# Patient Record
Sex: Female | Born: 1935 | Race: White | Hispanic: No | Smoking: Never smoker
Health system: Southern US, Community
[De-identification: ages and names within clinical notes are randomized; demographics above are authoritative.]

## PROBLEM LIST (undated history)

## (undated) DIAGNOSIS — G479 Sleep disorder, unspecified: Secondary | ICD-10-CM

## (undated) DIAGNOSIS — M8580 Other specified disorders of bone density and structure, unspecified site: Secondary | ICD-10-CM

## (undated) DIAGNOSIS — R252 Cramp and spasm: Secondary | ICD-10-CM

## (undated) DIAGNOSIS — I10 Essential (primary) hypertension: Secondary | ICD-10-CM

## (undated) DIAGNOSIS — R350 Frequency of micturition: Secondary | ICD-10-CM

## (undated) DIAGNOSIS — E559 Vitamin D deficiency, unspecified: Secondary | ICD-10-CM

## (undated) DIAGNOSIS — K635 Polyp of colon: Secondary | ICD-10-CM

## (undated) DIAGNOSIS — K648 Other hemorrhoids: Secondary | ICD-10-CM

## (undated) DIAGNOSIS — H269 Unspecified cataract: Secondary | ICD-10-CM

## (undated) DIAGNOSIS — I639 Cerebral infarction, unspecified: Secondary | ICD-10-CM

## (undated) DIAGNOSIS — K5792 Diverticulitis of intestine, part unspecified, without perforation or abscess without bleeding: Secondary | ICD-10-CM

## (undated) HISTORY — PX: UTERINE FIBROID SURGERY: SHX826

## (undated) HISTORY — DX: Other hemorrhoids: K64.8

## (undated) HISTORY — PX: CATARACT EXTRACTION: SUR2

## (undated) HISTORY — DX: Other specified disorders of bone density and structure, unspecified site: M85.80

## (undated) HISTORY — DX: Unspecified cataract: H26.9

## (undated) HISTORY — DX: Essential (primary) hypertension: I10

## (undated) HISTORY — DX: Vitamin D deficiency, unspecified: E55.9

## (undated) HISTORY — DX: Sleep disorder, unspecified: G47.9

## (undated) HISTORY — DX: Diverticulitis of intestine, part unspecified, without perforation or abscess without bleeding: K57.92

## (undated) HISTORY — DX: Cramp and spasm: R25.2

## (undated) HISTORY — DX: Frequency of micturition: R35.0

## (undated) HISTORY — PX: COLONOSCOPY: SHX174

## (undated) HISTORY — DX: Cerebral infarction, unspecified: I63.9

---
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
## 1988-09-09 DIAGNOSIS — K635 Polyp of colon: Secondary | ICD-10-CM

## 1988-09-09 HISTORY — DX: Polyp of colon: K63.5

## 1998-02-27 ENCOUNTER — Ambulatory Visit (HOSPITAL_COMMUNITY): Admission: RE | Admit: 1998-02-27 | Discharge: 1998-02-27 | Payer: Self-pay | Admitting: *Deleted

## 1998-07-19 ENCOUNTER — Ambulatory Visit (HOSPITAL_COMMUNITY): Admission: RE | Admit: 1998-07-19 | Discharge: 1998-07-19 | Payer: Self-pay | Admitting: Endocrinology

## 1998-07-19 ENCOUNTER — Encounter: Payer: Self-pay | Admitting: Endocrinology

## 1999-06-18 ENCOUNTER — Other Ambulatory Visit: Admission: RE | Admit: 1999-06-18 | Discharge: 1999-06-18 | Payer: Self-pay | Admitting: Obstetrics and Gynecology

## 1999-08-06 ENCOUNTER — Encounter: Payer: Self-pay | Admitting: Endocrinology

## 1999-08-06 ENCOUNTER — Ambulatory Visit (HOSPITAL_COMMUNITY): Admission: RE | Admit: 1999-08-06 | Discharge: 1999-08-06 | Payer: Self-pay | Admitting: Endocrinology

## 1999-10-10 ENCOUNTER — Encounter: Admission: RE | Admit: 1999-10-10 | Discharge: 1999-10-10 | Payer: Self-pay | Admitting: *Deleted

## 1999-10-10 ENCOUNTER — Encounter: Payer: Self-pay | Admitting: *Deleted

## 2000-08-05 ENCOUNTER — Encounter: Payer: Self-pay | Admitting: Internal Medicine

## 2000-08-05 ENCOUNTER — Ambulatory Visit (HOSPITAL_COMMUNITY): Admission: RE | Admit: 2000-08-05 | Discharge: 2000-08-05 | Payer: Self-pay | Admitting: Internal Medicine

## 2000-08-27 ENCOUNTER — Ambulatory Visit (HOSPITAL_COMMUNITY): Admission: RE | Admit: 2000-08-27 | Discharge: 2000-08-27 | Payer: Self-pay | Admitting: Internal Medicine

## 2000-08-27 ENCOUNTER — Encounter: Payer: Self-pay | Admitting: Internal Medicine

## 2001-03-11 ENCOUNTER — Ambulatory Visit (HOSPITAL_COMMUNITY): Admission: RE | Admit: 2001-03-11 | Discharge: 2001-03-11 | Payer: Self-pay | Admitting: Internal Medicine

## 2001-03-11 ENCOUNTER — Encounter: Payer: Self-pay | Admitting: Internal Medicine

## 2001-07-06 ENCOUNTER — Encounter: Payer: Self-pay | Admitting: Obstetrics and Gynecology

## 2001-07-06 ENCOUNTER — Encounter: Admission: RE | Admit: 2001-07-06 | Discharge: 2001-07-06 | Payer: Self-pay | Admitting: Obstetrics and Gynecology

## 2001-11-04 ENCOUNTER — Encounter: Payer: Self-pay | Admitting: Orthopedic Surgery

## 2001-11-04 ENCOUNTER — Encounter: Admission: RE | Admit: 2001-11-04 | Discharge: 2001-11-04 | Payer: Self-pay | Admitting: Orthopedic Surgery

## 2002-03-23 ENCOUNTER — Encounter: Payer: Self-pay | Admitting: Internal Medicine

## 2002-03-23 ENCOUNTER — Ambulatory Visit (HOSPITAL_COMMUNITY): Admission: RE | Admit: 2002-03-23 | Discharge: 2002-03-23 | Payer: Self-pay | Admitting: Internal Medicine

## 2002-11-18 ENCOUNTER — Encounter: Payer: Self-pay | Admitting: Otolaryngology

## 2002-11-18 ENCOUNTER — Encounter: Admission: RE | Admit: 2002-11-18 | Discharge: 2002-11-18 | Payer: Self-pay | Admitting: Otolaryngology

## 2003-04-07 ENCOUNTER — Ambulatory Visit (HOSPITAL_COMMUNITY): Admission: RE | Admit: 2003-04-07 | Discharge: 2003-04-07 | Payer: Self-pay | Admitting: Internal Medicine

## 2003-04-07 ENCOUNTER — Encounter: Payer: Self-pay | Admitting: Internal Medicine

## 2003-05-09 ENCOUNTER — Ambulatory Visit (HOSPITAL_COMMUNITY): Admission: RE | Admit: 2003-05-09 | Discharge: 2003-05-09 | Payer: Self-pay | Admitting: *Deleted

## 2003-06-08 ENCOUNTER — Encounter: Admission: RE | Admit: 2003-06-08 | Discharge: 2003-07-06 | Payer: Self-pay | Admitting: Orthopedic Surgery

## 2004-03-19 ENCOUNTER — Encounter: Admission: RE | Admit: 2004-03-19 | Discharge: 2004-03-22 | Payer: Self-pay | Admitting: Orthopedic Surgery

## 2004-04-12 ENCOUNTER — Ambulatory Visit (HOSPITAL_COMMUNITY): Admission: RE | Admit: 2004-04-12 | Discharge: 2004-04-12 | Payer: Self-pay | Admitting: Internal Medicine

## 2005-04-25 ENCOUNTER — Ambulatory Visit (HOSPITAL_COMMUNITY): Admission: RE | Admit: 2005-04-25 | Discharge: 2005-04-25 | Payer: Self-pay | Admitting: Internal Medicine

## 2005-07-03 ENCOUNTER — Ambulatory Visit (HOSPITAL_COMMUNITY): Admission: RE | Admit: 2005-07-03 | Discharge: 2005-07-03 | Payer: Self-pay | Admitting: *Deleted

## 2006-06-18 ENCOUNTER — Ambulatory Visit (HOSPITAL_COMMUNITY): Admission: RE | Admit: 2006-06-18 | Discharge: 2006-06-18 | Payer: Self-pay | Admitting: Internal Medicine

## 2007-06-25 ENCOUNTER — Ambulatory Visit (HOSPITAL_COMMUNITY): Admission: RE | Admit: 2007-06-25 | Discharge: 2007-06-25 | Payer: Self-pay | Admitting: Internal Medicine

## 2007-07-06 ENCOUNTER — Encounter: Admission: RE | Admit: 2007-07-06 | Discharge: 2007-07-06 | Payer: Self-pay | Admitting: Internal Medicine

## 2008-07-01 ENCOUNTER — Ambulatory Visit (HOSPITAL_COMMUNITY): Admission: RE | Admit: 2008-07-01 | Discharge: 2008-07-01 | Payer: Self-pay | Admitting: Internal Medicine

## 2008-11-29 ENCOUNTER — Encounter: Admission: RE | Admit: 2008-11-29 | Discharge: 2008-11-29 | Payer: Self-pay | Admitting: Internal Medicine

## 2009-07-12 ENCOUNTER — Ambulatory Visit (HOSPITAL_COMMUNITY): Admission: RE | Admit: 2009-07-12 | Discharge: 2009-07-12 | Payer: Self-pay | Admitting: Internal Medicine

## 2010-07-19 ENCOUNTER — Ambulatory Visit (HOSPITAL_COMMUNITY): Admission: RE | Admit: 2010-07-19 | Discharge: 2010-07-19 | Payer: Self-pay | Admitting: Internal Medicine

## 2010-11-08 ENCOUNTER — Other Ambulatory Visit: Payer: Self-pay | Admitting: Internal Medicine

## 2010-11-08 DIAGNOSIS — R490 Dysphonia: Secondary | ICD-10-CM

## 2010-11-19 ENCOUNTER — Other Ambulatory Visit: Payer: Self-pay | Admitting: Otolaryngology

## 2010-11-20 ENCOUNTER — Ambulatory Visit
Admission: RE | Admit: 2010-11-20 | Discharge: 2010-11-20 | Disposition: A | Discharge status: Home / Self Care | Payer: Medicare Other | Source: Ambulatory Visit | Attending: Otolaryngology | Admitting: Otolaryngology

## 2011-01-14 ENCOUNTER — Other Ambulatory Visit: Payer: Self-pay

## 2011-01-25 NOTE — Op Note (Signed)
   NAME:  Megan Bowen, Megan Bowen                      ACCOUNT NO.:  [id number]   MEDICAL RECORD NO.:  [id number]                   PATIENT TYPE:  AMB   LOCATION:  ENDO                                 FACILITY:  [facility]   PHYSICIAN:  Georgiana Spinner, M.D.                 DATE OF BIRTH:  Jul 07, 1936   DATE OF PROCEDURE:  DATE OF DISCHARGE:                                 OPERATIVE REPORT   PROCEDURE:  Colonoscopy.   INDICATIONS FOR PROCEDURE:  Colon polyps.   ANESTHESIA:  Fentanyl 100 mcg, Versed 10 mg.   DESCRIPTION OF PROCEDURE:  With the patient mildly sedated in the left  lateral decubitus position, the Olympus videoscopic colonoscope was inserted  in the rectum and passed under direct vision to the cecum identified by base  of cecum, ileocecal valve and transillumination of the right lower quadrant  of the abdomen. From this point, the colonoscope was slowly withdrawn taking  circumferential views of the colonic mucosa stopping only in the rectum  which appeared normal on direct and showed hemorrhoids on retroflexed view.  The endoscope was straightened and withdrawn. The patient's vital signs and  pulse oximeter remained stable. The patient tolerated the procedure well  without apparent complications.   FINDINGS:  Internal hemorrhoids otherwise unremarkable examination.   PLAN:  Repeat examination possibly in five years.                                               Georgiana Spinner, M.D.    GMO/MEDQ  D:  05/09/2003  T:  05/09/2003  Job:  [id number]

## 2011-01-25 NOTE — Op Note (Signed)
NAME:  Megan Bowen, Megan Bowen NO.:  [id number]   MEDICAL RECORD NO.:  [id number]          PATIENT TYPE:  AMB   LOCATION:  ENDO                         FACILITY:  [facility]   PHYSICIAN:  Georgiana Spinner, M.D.    DATE OF BIRTH:  06-Dec-1935   DATE OF PROCEDURE:  07/03/2005  DATE OF DISCHARGE:                                 OPERATIVE REPORT   PROCEDURE:  Upper endoscopy.   INDICATIONS:  Abdominal pain.   ANESTHESIA:  Fentanyl 50 mcg, Versed 7 mg.   PROCEDURE:  With the patient mildly sedated in the left lateral decubitus  position, the Olympus videoscopic endoscope was inserted in the mouth and  passed under direct vision through the esophagus which appeared normal.  There was no evidence of Barrett's or esophagitis. We then advanced into the  stomach. The fundus, body, antrum, duodenal bulb, second portion of the  duodenum were visualized. From this point, the endoscope was slowly  withdrawn taking circumferential views of the duodenal mucosa until the  endoscope had been pulled back in the stomach, placed in retroflexion to  view the stomach from below. The endoscope was straightened and withdrawn  taking circumferential views of the remaining gastric and esophageal mucosa.  The patient's vital signs and pulse oximeter remained stable. The patient  tolerated the procedure well without apparent complications.   FINDINGS:  Unremarkable examination.   PLAN:  Have patient follow-up with me as an outpatient.           ______________________________  Georgiana Spinner, M.D.     GMO/MEDQ  D:  07/03/2005  T:  07/03/2005  Job:  [id number]

## 2011-06-10 ENCOUNTER — Other Ambulatory Visit (HOSPITAL_COMMUNITY): Payer: Self-pay | Admitting: Internal Medicine

## 2011-06-10 DIAGNOSIS — Z1231 Encounter for screening mammogram for malignant neoplasm of breast: Secondary | ICD-10-CM

## 2011-07-25 ENCOUNTER — Ambulatory Visit (HOSPITAL_COMMUNITY)
Admission: RE | Admit: 2011-07-25 | Discharge: 2011-07-25 | Disposition: A | Discharge status: Home / Self Care | Payer: Medicare Other | Source: Ambulatory Visit | Attending: Internal Medicine | Admitting: Internal Medicine

## 2011-07-25 DIAGNOSIS — Z1231 Encounter for screening mammogram for malignant neoplasm of breast: Secondary | ICD-10-CM

## 2012-06-08 ENCOUNTER — Other Ambulatory Visit (HOSPITAL_COMMUNITY): Payer: Self-pay | Admitting: Internal Medicine

## 2012-06-08 DIAGNOSIS — Z1231 Encounter for screening mammogram for malignant neoplasm of breast: Secondary | ICD-10-CM

## 2012-07-30 ENCOUNTER — Ambulatory Visit (HOSPITAL_COMMUNITY)
Admission: RE | Admit: 2012-07-30 | Discharge: 2012-07-30 | Disposition: A | Discharge status: Home / Self Care | Payer: Medicare Other | Source: Ambulatory Visit | Attending: Internal Medicine | Admitting: Internal Medicine

## 2012-07-30 DIAGNOSIS — Z1231 Encounter for screening mammogram for malignant neoplasm of breast: Secondary | ICD-10-CM | POA: Insufficient documentation

## 2013-08-02 ENCOUNTER — Other Ambulatory Visit (HOSPITAL_COMMUNITY): Payer: Self-pay | Admitting: Internal Medicine

## 2013-08-02 DIAGNOSIS — Z1231 Encounter for screening mammogram for malignant neoplasm of breast: Secondary | ICD-10-CM

## 2013-09-23 ENCOUNTER — Ambulatory Visit (HOSPITAL_COMMUNITY)
Admission: RE | Admit: 2013-09-23 | Discharge: 2013-09-23 | Disposition: A | Discharge status: Home / Self Care | Payer: Medicare Other | Source: Ambulatory Visit | Attending: Internal Medicine | Admitting: Internal Medicine

## 2013-09-23 DIAGNOSIS — Z1231 Encounter for screening mammogram for malignant neoplasm of breast: Secondary | ICD-10-CM | POA: Insufficient documentation

## 2014-06-01 ENCOUNTER — Ambulatory Visit (INDEPENDENT_AMBULATORY_CARE_PROVIDER_SITE_OTHER): Payer: Medicare Other

## 2014-06-01 ENCOUNTER — Ambulatory Visit: Payer: Self-pay

## 2014-06-01 VITALS — BP 133/81 | HR 85 | Resp 16 | Ht 62.0 in | Wt 112.0 lb

## 2014-06-01 DIAGNOSIS — M722 Plantar fascial fibromatosis: Secondary | ICD-10-CM

## 2014-06-01 DIAGNOSIS — M79609 Pain in unspecified limb: Secondary | ICD-10-CM

## 2014-06-01 DIAGNOSIS — M79673 Pain in unspecified foot: Secondary | ICD-10-CM

## 2014-06-01 NOTE — Progress Notes (Signed)
   Subjective:    Patient ID: Megan Bowen, female    DOB: 28-Sep-1935, 78 y.o.   MRN: [id number]  HPI Comments: "I have pain in the heel"  Patient c/o aching plantar heel left for about 3 months. She does have AM pain. Went to [facility] been given 3 injections of cortisone between July and August. They did do xrays and she brought those today. It is better, but seems to still be really tender at night. She has tried stretching and massaging-helps. She already had a night splint and has not helped.  Foot Pain      Review of Systems  Constitutional: Positive for activity change.  All other systems reviewed and are negative.      Objective:   Physical Exam 78 year old white female well-developed well-nourished oriented x3 presents at this time with a several month history of inferior left heel pain has had multiple treatments including 3 steroid injections ice stretching anti-inflammatories or Advil has had improvement although not complete resolution continues to have pain along the mid band of the plantar fascia medial calcaneal tubercle. Pain on first up in the morning his most severe. Next  Neurovascular status is intact pedal pulses palpable DP and PT plus one over 4 capillary refill time 3 seconds epicritic and proprioceptive sensations intact and symmetric bilateral mild varicosities noted x-rays reviewed reveal no inferior heel spur myofascial thickening rectus foot otherwise unremarkable x-rays are noted. Orthopedic exam reveals symmetric the fat pad relatively average to high arch myofascial thickening on the left foot noted tenderness on palpation of the left foot mid band high-fashion arch right foot is asymptomatic. Currently wearing a pair of Brooks shoes however does have sandals around the house other shoes that have likely no support or stability.       Assessment & Plan:  Assessment this time plantar fasciitis/heel spur syndrome which is risk on it to some of  the treatment however on the long-term followup intervention in the form of orthoses this time with power step OTC orthotic is dispensed with break in wearing instructions recommend followup in one to months for adjustments if needed maintain ice to the area and anti-inflammatory in the interim as needed maintain good stable shoes at all times including orthotics in her Rolena Infante or her SAS shoes and at home can wear a Birkenstock or bionic type sandal recheck in one to 2 months for reevaluation patient understands orthotics likely noncovered by her insurance coverage orthotics for as a trial for the next one to 2 months we'll reevaluate thereafter.  Harriet Masson DPM

## 2014-06-01 NOTE — Patient Instructions (Signed)

## 2014-06-23 ENCOUNTER — Ambulatory Visit: Payer: Medicare Other | Admitting: Internal Medicine

## 2014-08-09 ENCOUNTER — Ambulatory Visit: Payer: Medicare Other

## 2014-09-14 ENCOUNTER — Ambulatory Visit: Payer: Medicare Other

## 2014-09-20 ENCOUNTER — Ambulatory Visit: Payer: Medicare Other | Attending: Physical Medicine and Rehabilitation | Admitting: Physical Therapy

## 2014-09-20 DIAGNOSIS — M5136 Other intervertebral disc degeneration, lumbar region: Secondary | ICD-10-CM | POA: Diagnosis not present

## 2014-09-20 DIAGNOSIS — M545 Low back pain: Secondary | ICD-10-CM | POA: Insufficient documentation

## 2014-09-26 DIAGNOSIS — F411 Generalized anxiety disorder: Secondary | ICD-10-CM | POA: Diagnosis not present

## 2014-09-26 DIAGNOSIS — I10 Essential (primary) hypertension: Secondary | ICD-10-CM | POA: Diagnosis not present

## 2014-09-26 DIAGNOSIS — M722 Plantar fascial fibromatosis: Secondary | ICD-10-CM | POA: Diagnosis not present

## 2014-09-26 DIAGNOSIS — F5104 Psychophysiologic insomnia: Secondary | ICD-10-CM | POA: Diagnosis not present

## 2014-09-27 ENCOUNTER — Ambulatory Visit: Payer: Medicare Other | Admitting: Physical Therapy

## 2014-09-27 DIAGNOSIS — M5136 Other intervertebral disc degeneration, lumbar region: Secondary | ICD-10-CM | POA: Diagnosis not present

## 2014-09-27 DIAGNOSIS — M545 Low back pain: Secondary | ICD-10-CM | POA: Diagnosis not present

## 2014-10-06 DIAGNOSIS — M79602 Pain in left arm: Secondary | ICD-10-CM | POA: Diagnosis not present

## 2014-10-06 DIAGNOSIS — R030 Elevated blood-pressure reading, without diagnosis of hypertension: Secondary | ICD-10-CM | POA: Diagnosis not present

## 2014-10-06 DIAGNOSIS — R002 Palpitations: Secondary | ICD-10-CM | POA: Diagnosis not present

## 2014-10-11 DIAGNOSIS — R002 Palpitations: Secondary | ICD-10-CM | POA: Diagnosis not present

## 2014-10-17 DIAGNOSIS — M79602 Pain in left arm: Secondary | ICD-10-CM | POA: Diagnosis not present

## 2014-10-17 DIAGNOSIS — R002 Palpitations: Secondary | ICD-10-CM | POA: Diagnosis not present

## 2014-10-27 DIAGNOSIS — R002 Palpitations: Secondary | ICD-10-CM | POA: Diagnosis not present

## 2014-10-27 DIAGNOSIS — R030 Elevated blood-pressure reading, without diagnosis of hypertension: Secondary | ICD-10-CM | POA: Diagnosis not present

## 2014-10-27 DIAGNOSIS — M79602 Pain in left arm: Secondary | ICD-10-CM | POA: Diagnosis not present

## 2014-10-27 DIAGNOSIS — R931 Abnormal findings on diagnostic imaging of heart and coronary circulation: Secondary | ICD-10-CM | POA: Diagnosis not present

## 2014-11-03 ENCOUNTER — Other Ambulatory Visit (HOSPITAL_COMMUNITY): Payer: Self-pay | Admitting: Internal Medicine

## 2014-11-03 DIAGNOSIS — Z1231 Encounter for screening mammogram for malignant neoplasm of breast: Secondary | ICD-10-CM

## 2014-11-08 ENCOUNTER — Ambulatory Visit: Payer: Self-pay

## 2014-11-17 DIAGNOSIS — H04213 Epiphora due to excess lacrimation, bilateral lacrimal glands: Secondary | ICD-10-CM | POA: Diagnosis not present

## 2014-11-17 DIAGNOSIS — H04123 Dry eye syndrome of bilateral lacrimal glands: Secondary | ICD-10-CM | POA: Diagnosis not present

## 2014-11-17 DIAGNOSIS — H01003 Unspecified blepharitis right eye, unspecified eyelid: Secondary | ICD-10-CM | POA: Diagnosis not present

## 2014-11-17 DIAGNOSIS — L718 Other rosacea: Secondary | ICD-10-CM | POA: Diagnosis not present

## 2014-11-29 ENCOUNTER — Ambulatory Visit (HOSPITAL_COMMUNITY)
Admission: RE | Admit: 2014-11-29 | Discharge: 2014-11-29 | Disposition: A | Discharge status: Home / Self Care | Payer: Medicare Other | Source: Ambulatory Visit | Attending: Internal Medicine | Admitting: Internal Medicine

## 2014-11-29 DIAGNOSIS — Z1231 Encounter for screening mammogram for malignant neoplasm of breast: Secondary | ICD-10-CM | POA: Insufficient documentation

## 2014-12-21 ENCOUNTER — Encounter: Payer: Self-pay | Admitting: Podiatry

## 2014-12-21 ENCOUNTER — Ambulatory Visit (INDEPENDENT_AMBULATORY_CARE_PROVIDER_SITE_OTHER): Payer: Medicare Other | Admitting: Podiatry

## 2014-12-21 VITALS — BP 131/75 | HR 84 | Resp 14

## 2014-12-21 DIAGNOSIS — M7742 Metatarsalgia, left foot: Secondary | ICD-10-CM

## 2014-12-21 DIAGNOSIS — M7741 Metatarsalgia, right foot: Secondary | ICD-10-CM

## 2014-12-21 DIAGNOSIS — M216X9 Other acquired deformities of unspecified foot: Secondary | ICD-10-CM | POA: Diagnosis not present

## 2014-12-21 NOTE — Progress Notes (Signed)
   Subjective:    Patient ID: Megan Bowen, female    DOB: Nov 23, 1935, 79 y.o.   MRN: [id number]  HPI N- sensitive and redness, burning sensation in both feet L-B/L Ball of feet and heel D-2 months O-slowly C-not getting worse A - pressure when wearing some shoes T-rolling cans, stretching  Symptoms she describes as a scraping burning sensation occur with weightbearing activity Patient has had ongoing physical examination including glucose levels without any elevation.  Review of Systems  All other systems reviewed and are negative.      Objective:   Physical Exam  Orientated 3 patient presents with her husband present treatment room  Vascular: DP and PT pulses 2/4 bilaterally Capillary reflex immediate bilaterally  Neurological: Ankle reflex equal and reactive bilaterally Vibratory sensation intact bilaterally Sensation to 10 g monofilament wire intact 5/5 bilaterally  Dermatological: Atrophic fad pads MPJ and heels bilaterally  Musculoskeletal: No restriction ankle, subtalar, midtarsal joints Mild palpable tenderness plantar inferior heel and plantar MPJ bilaterally       Assessment & Plan:   Assessment: Atrophic fad pads MPJ heels bilaterally Metatarsalgia bilaterally Plantar fasciitis bilaterally  Plan: I discussed with patient in detail that the primary cause of her discomfort seems to be related to her atrophic fad pads. I dispensed to [employer] metatarsal straps with good cushioning to attach to her forefoot area where a continuous basis and they thick sole walking or running style shoe  Also, patient has pending yearly evaluation and emphasized to patient to make sure her blood glucose levels were within normal limits because of her mention of burning  Reappoint at patient's request

## 2014-12-22 ENCOUNTER — Encounter: Payer: Self-pay | Admitting: Podiatry

## 2015-02-04 DIAGNOSIS — N390 Urinary tract infection, site not specified: Secondary | ICD-10-CM | POA: Diagnosis not present

## 2015-02-13 DIAGNOSIS — N390 Urinary tract infection, site not specified: Secondary | ICD-10-CM | POA: Diagnosis not present

## 2015-02-15 DIAGNOSIS — R300 Dysuria: Secondary | ICD-10-CM | POA: Diagnosis not present

## 2015-02-15 DIAGNOSIS — R3915 Urgency of urination: Secondary | ICD-10-CM | POA: Diagnosis not present

## 2015-02-15 DIAGNOSIS — R350 Frequency of micturition: Secondary | ICD-10-CM | POA: Diagnosis not present

## 2015-02-15 DIAGNOSIS — N952 Postmenopausal atrophic vaginitis: Secondary | ICD-10-CM | POA: Diagnosis not present

## 2015-02-15 DIAGNOSIS — R3989 Other symptoms and signs involving the genitourinary system: Secondary | ICD-10-CM | POA: Diagnosis not present

## 2015-03-28 DIAGNOSIS — E039 Hypothyroidism, unspecified: Secondary | ICD-10-CM | POA: Diagnosis not present

## 2015-03-28 DIAGNOSIS — R5383 Other fatigue: Secondary | ICD-10-CM | POA: Diagnosis not present

## 2015-03-28 DIAGNOSIS — I10 Essential (primary) hypertension: Secondary | ICD-10-CM | POA: Diagnosis not present

## 2015-04-04 DIAGNOSIS — F5104 Psychophysiologic insomnia: Secondary | ICD-10-CM | POA: Diagnosis not present

## 2015-04-04 DIAGNOSIS — Z0000 Encounter for general adult medical examination without abnormal findings: Secondary | ICD-10-CM | POA: Diagnosis not present

## 2015-04-04 DIAGNOSIS — I10 Essential (primary) hypertension: Secondary | ICD-10-CM | POA: Diagnosis not present

## 2015-05-01 DIAGNOSIS — Z1212 Encounter for screening for malignant neoplasm of rectum: Secondary | ICD-10-CM | POA: Diagnosis not present

## 2015-05-14 DIAGNOSIS — T22232A Burn of second degree of left upper arm, initial encounter: Secondary | ICD-10-CM | POA: Diagnosis not present

## 2015-07-04 DIAGNOSIS — H25813 Combined forms of age-related cataract, bilateral: Secondary | ICD-10-CM | POA: Diagnosis not present

## 2015-07-04 DIAGNOSIS — H04123 Dry eye syndrome of bilateral lacrimal glands: Secondary | ICD-10-CM | POA: Diagnosis not present

## 2015-10-20 DIAGNOSIS — G5791 Unspecified mononeuropathy of right lower limb: Secondary | ICD-10-CM | POA: Diagnosis not present

## 2015-11-02 DIAGNOSIS — Z7689 Persons encountering health services in other specified circumstances: Secondary | ICD-10-CM | POA: Diagnosis not present

## 2015-11-02 DIAGNOSIS — G5791 Unspecified mononeuropathy of right lower limb: Secondary | ICD-10-CM | POA: Diagnosis not present

## 2015-11-02 DIAGNOSIS — R0981 Nasal congestion: Secondary | ICD-10-CM | POA: Diagnosis not present

## 2015-11-19 DIAGNOSIS — R091 Pleurisy: Secondary | ICD-10-CM | POA: Diagnosis not present

## 2015-11-19 DIAGNOSIS — M940 Chondrocostal junction syndrome [Tietze]: Secondary | ICD-10-CM | POA: Diagnosis not present

## 2015-11-27 DIAGNOSIS — N390 Urinary tract infection, site not specified: Secondary | ICD-10-CM | POA: Diagnosis not present

## 2015-11-27 DIAGNOSIS — B3749 Other urogenital candidiasis: Secondary | ICD-10-CM | POA: Diagnosis not present

## 2015-11-27 DIAGNOSIS — R0781 Pleurodynia: Secondary | ICD-10-CM | POA: Diagnosis not present

## 2015-11-27 DIAGNOSIS — R091 Pleurisy: Secondary | ICD-10-CM | POA: Diagnosis not present

## 2015-11-27 DIAGNOSIS — M940 Chondrocostal junction syndrome [Tietze]: Secondary | ICD-10-CM | POA: Diagnosis not present

## 2015-11-30 ENCOUNTER — Other Ambulatory Visit (HOSPITAL_BASED_OUTPATIENT_CLINIC_OR_DEPARTMENT_OTHER): Payer: Self-pay | Admitting: Internal Medicine

## 2015-11-30 DIAGNOSIS — Z1231 Encounter for screening mammogram for malignant neoplasm of breast: Secondary | ICD-10-CM

## 2015-12-06 DIAGNOSIS — R0981 Nasal congestion: Secondary | ICD-10-CM | POA: Diagnosis not present

## 2015-12-06 DIAGNOSIS — R091 Pleurisy: Secondary | ICD-10-CM | POA: Diagnosis not present

## 2015-12-06 DIAGNOSIS — M940 Chondrocostal junction syndrome [Tietze]: Secondary | ICD-10-CM | POA: Diagnosis not present

## 2015-12-19 ENCOUNTER — Ambulatory Visit (HOSPITAL_BASED_OUTPATIENT_CLINIC_OR_DEPARTMENT_OTHER): Payer: Medicare Other

## 2016-01-15 ENCOUNTER — Ambulatory Visit (HOSPITAL_BASED_OUTPATIENT_CLINIC_OR_DEPARTMENT_OTHER): Payer: Medicare Other

## 2016-03-14 ENCOUNTER — Ambulatory Visit (HOSPITAL_BASED_OUTPATIENT_CLINIC_OR_DEPARTMENT_OTHER)
Admission: RE | Admit: 2016-03-14 | Discharge: 2016-03-14 | Disposition: A | Discharge status: Home / Self Care | Payer: Medicare Other | Source: Ambulatory Visit | Attending: Internal Medicine | Admitting: Internal Medicine

## 2016-03-14 DIAGNOSIS — Z1231 Encounter for screening mammogram for malignant neoplasm of breast: Secondary | ICD-10-CM

## 2017-02-24 ENCOUNTER — Encounter: Payer: Self-pay | Admitting: Podiatry

## 2017-02-24 ENCOUNTER — Ambulatory Visit (INDEPENDENT_AMBULATORY_CARE_PROVIDER_SITE_OTHER): Payer: Medicare Other | Admitting: Podiatry

## 2017-02-24 VITALS — BP 139/60 | HR 78 | Ht 63.0 in | Wt 120.0 lb

## 2017-02-24 DIAGNOSIS — M7741 Metatarsalgia, right foot: Secondary | ICD-10-CM | POA: Diagnosis not present

## 2017-02-24 DIAGNOSIS — M79671 Pain in right foot: Secondary | ICD-10-CM | POA: Diagnosis not present

## 2017-02-24 DIAGNOSIS — Q828 Other specified congenital malformations of skin: Secondary | ICD-10-CM

## 2017-02-24 DIAGNOSIS — M7742 Metatarsalgia, left foot: Secondary | ICD-10-CM | POA: Diagnosis not present

## 2017-02-24 DIAGNOSIS — M79672 Pain in left foot: Secondary | ICD-10-CM | POA: Diagnosis not present

## 2017-02-24 NOTE — Progress Notes (Signed)
SUBJECTIVE: 81 y.o. year old female presents complaining of pain under the ball of both feet. Patient points calluses under 2nd and 3rd MPJ right and under 5th MPJ left. Patient request for trimming.  Patient is wearing regular walking shoe with cushioned shoe liner.   REVIEW OF SYSTEMS: Pertinent items noted in HPI and remainder of comprehensive ROS otherwise negative.  OBJECTIVE: DERMATOLOGIC EXAMINATION: Nails: Mildly hypertrophic. Porokeratosis under 5th MPJ left. Broad callus under 2nd and 3rd MPJ right.   VASCULAR EXAMINATION OF LOWER LIMBS: All pedal pulses are palpable with normal pulsation.  Capillary Filling times within 3 seconds in all digits.  No edema or erythema noted. Temperature gradient from tibial crest to dorsum of foot is within normal bilateral.  NEUROLOGIC EXAMINATION OF THE LOWER LIMBS: Achilles DTR is present and within normal. Monofilament (Semmes-Weinstein 10-gm) sensory testing positive 6 out of 6, bilateral. Vibratory sensations(128Hz  turning fork) intact at medial and lateral forefoot bilateral.  Sharp and Dull discriminatory sensations at the plantar ball of hallux is intact bilateral.   MUSCULOSKELETAL EXAMINATION: Positive for hypertrophic 5th MPJ left foot.   ASSESSMENT: Metatarsalgia 5th left, 2nd and 3rd right. Porokeratosis bilateral.  PLAN: Reviewed findings and available treatment options. All lesions debrided. Return as needed.

## 2017-02-24 NOTE — Patient Instructions (Signed)
Seen for hypertrophic calluses on both feet. Painful calluses debrided. No abnormal findings noted. Return as needed.

## 2017-03-10 ENCOUNTER — Other Ambulatory Visit (HOSPITAL_BASED_OUTPATIENT_CLINIC_OR_DEPARTMENT_OTHER): Payer: Self-pay | Admitting: Family Medicine

## 2017-03-10 DIAGNOSIS — Z1231 Encounter for screening mammogram for malignant neoplasm of breast: Secondary | ICD-10-CM

## 2017-03-20 ENCOUNTER — Ambulatory Visit (HOSPITAL_BASED_OUTPATIENT_CLINIC_OR_DEPARTMENT_OTHER)
Admission: RE | Admit: 2017-03-20 | Discharge: 2017-03-20 | Disposition: A | Discharge status: Home / Self Care | Payer: Medicare Other | Source: Ambulatory Visit | Attending: Family Medicine | Admitting: Family Medicine

## 2017-03-20 ENCOUNTER — Other Ambulatory Visit (HOSPITAL_BASED_OUTPATIENT_CLINIC_OR_DEPARTMENT_OTHER): Payer: Self-pay | Admitting: Family Medicine

## 2017-03-20 DIAGNOSIS — Z1231 Encounter for screening mammogram for malignant neoplasm of breast: Secondary | ICD-10-CM | POA: Diagnosis not present

## 2017-11-04 ENCOUNTER — Encounter: Payer: Self-pay | Admitting: Podiatry

## 2017-11-04 ENCOUNTER — Ambulatory Visit: Payer: Medicare Other | Admitting: Podiatry

## 2017-11-04 DIAGNOSIS — M79672 Pain in left foot: Secondary | ICD-10-CM

## 2017-11-04 DIAGNOSIS — M7742 Metatarsalgia, left foot: Secondary | ICD-10-CM

## 2017-11-04 DIAGNOSIS — M7741 Metatarsalgia, right foot: Secondary | ICD-10-CM

## 2017-11-04 DIAGNOSIS — M79671 Pain in right foot: Secondary | ICD-10-CM

## 2017-11-04 NOTE — Patient Instructions (Signed)
Seen for pain on 4th toe right foot. Noted of thinned down fat pad. Possible neuroma pain. May benefit from metatarsal pad. 1/4" Felt pad placed under the shoe liner on right foot. Reviewed proper shoe gear. Return as needed.

## 2017-11-04 NOTE — Progress Notes (Signed)
Subjective: 82 y.o. year old female patient presents complaining of pain in 3rd toe right with or without weight bearing and wants to check on right foot calluses. Patient is seeking proper shoe gear to wear at home but do now know what size shoe she should order.  Objective: Dermatologic: Thin plantar callus left foot. Vascular: Pedal pulses are all palpable. Orthopedic: Hypertrophic 5th MPJ left foot. Neurologic: All epicritic and tactile sensations grossly intact.  Assessment: Neuralgia 4th toe right. Loss of plantar fat pad bilateral.  Treatment: Reviewed findings and available treatment options. 1/4" metatarsal pad placed under 3rd and 4th MPJ right foot. As per request both feet measured for her house shoe.

## 2017-12-02 ENCOUNTER — Emergency Department (HOSPITAL_BASED_OUTPATIENT_CLINIC_OR_DEPARTMENT_OTHER)
Admission: EM | Admit: 2017-12-02 | Discharge: 2017-12-02 | Disposition: A | Discharge status: Home / Self Care | Payer: Medicare Other | Attending: Emergency Medicine | Admitting: Emergency Medicine

## 2017-12-02 ENCOUNTER — Other Ambulatory Visit: Payer: Self-pay

## 2017-12-02 ENCOUNTER — Encounter (HOSPITAL_BASED_OUTPATIENT_CLINIC_OR_DEPARTMENT_OTHER): Payer: Self-pay | Admitting: Emergency Medicine

## 2017-12-02 ENCOUNTER — Emergency Department (HOSPITAL_BASED_OUTPATIENT_CLINIC_OR_DEPARTMENT_OTHER): Payer: Medicare Other

## 2017-12-02 ENCOUNTER — Emergency Department (HOSPITAL_COMMUNITY): Payer: Medicare Other

## 2017-12-02 DIAGNOSIS — R1030 Lower abdominal pain, unspecified: Secondary | ICD-10-CM

## 2017-12-02 DIAGNOSIS — K4090 Unilateral inguinal hernia, without obstruction or gangrene, not specified as recurrent: Secondary | ICD-10-CM | POA: Insufficient documentation

## 2017-12-02 DIAGNOSIS — Z79899 Other long term (current) drug therapy: Secondary | ICD-10-CM | POA: Diagnosis not present

## 2017-12-02 LAB — CBC WITH DIFFERENTIAL/PLATELET
BASOS ABS: 0 10*3/uL (ref 0.0–0.1)
BASOS PCT: 0 %
EOS ABS: 0 10*3/uL (ref 0.0–0.7)
EOS PCT: 0 %
HCT: 38.2 % (ref 36.0–46.0)
Hemoglobin: 13.7 g/dL (ref 12.0–15.0)
LYMPHS PCT: 11 %
Lymphs Abs: 1 10*3/uL (ref 0.7–4.0)
MCH: 33.3 pg (ref 26.0–34.0)
MCHC: 35.9 g/dL (ref 30.0–36.0)
MCV: 92.7 fL (ref 78.0–100.0)
MONO ABS: 0.8 10*3/uL (ref 0.1–1.0)
Monocytes Relative: 9 %
Neutro Abs: 7.1 10*3/uL (ref 1.7–7.7)
Neutrophils Relative %: 80 %
PLATELETS: 185 10*3/uL (ref 150–400)
RBC: 4.12 MIL/uL (ref 3.87–5.11)
RDW: 12.6 % (ref 11.5–15.5)
WBC: 8.9 10*3/uL (ref 4.0–10.5)

## 2017-12-02 LAB — COMPREHENSIVE METABOLIC PANEL
ALT: 18 U/L (ref 14–54)
AST: 21 U/L (ref 15–41)
Albumin: 4.4 g/dL (ref 3.5–5.0)
Alkaline Phosphatase: 80 U/L (ref 38–126)
Anion gap: 9 (ref 5–15)
BUN: 19 mg/dL (ref 6–20)
CHLORIDE: 97 mmol/L — AB (ref 101–111)
CO2: 27 mmol/L (ref 22–32)
CREATININE: 0.68 mg/dL (ref 0.44–1.00)
Calcium: 9.3 mg/dL (ref 8.9–10.3)
GFR calc Af Amer: >60 mL/min (ref >60)
Glucose, Bld: 109 mg/dL — ABNORMAL HIGH (ref 65–99)
POTASSIUM: 4 mmol/L (ref 3.5–5.1)
SODIUM: 133 mmol/L — AB (ref 135–145)
Total Bilirubin: 0.7 mg/dL (ref 0.3–1.2)
Total Protein: 7.4 g/dL (ref 6.5–8.1)

## 2017-12-02 LAB — URINALYSIS, ROUTINE W REFLEX MICROSCOPIC
BILIRUBIN URINE: NEGATIVE
GLUCOSE, UA: NEGATIVE mg/dL
KETONES UR: NEGATIVE mg/dL
Leukocytes, UA: NEGATIVE
Nitrite: NEGATIVE
PROTEIN: NEGATIVE mg/dL
Specific Gravity, Urine: 1.01 (ref 1.005–1.030)
pH: 7 (ref 5.0–8.0)

## 2017-12-02 LAB — URINALYSIS, MICROSCOPIC (REFLEX): WBC UA: NONE SEEN WBC/hpf (ref 0–5)

## 2017-12-02 MED ORDER — SODIUM CHLORIDE 0.9 % IV BOLUS
500.0000 mL | Freq: Once | INTRAVENOUS | Status: AC
Start: 2017-12-02 — End: 2017-12-02
  Administered 2017-12-02: 500 mL via INTRAVENOUS

## 2017-12-02 MED ORDER — TRAMADOL HCL 50 MG PO TABS: 50.0000 mg | ORAL_TABLET | Freq: Four times a day (QID) | ORAL | 0 refills | Status: DC | PRN

## 2017-12-02 MED ORDER — MORPHINE SULFATE (PF) 2 MG/ML IV SOLN
2.0000 mg | INTRAVENOUS | Status: DC | PRN
  Administered 2017-12-02: 2 mg via INTRAVENOUS
  Filled 2017-12-02: qty 1

## 2017-12-02 MED ORDER — IOPAMIDOL (ISOVUE-300) INJECTION 61%
INTRAVENOUS | Status: AC
  Administered 2017-12-02: 100 mL
  Filled 2017-12-02: qty 100

## 2017-12-02 MED ORDER — MORPHINE SULFATE (PF) 4 MG/ML IV SOLN: 2.0000 mg | INTRAVENOUS | Status: DC | PRN

## 2017-12-02 MED ORDER — ONDANSETRON HCL 4 MG/2ML IJ SOLN
4.0000 mg | Freq: Once | INTRAMUSCULAR | Status: AC
  Administered 2017-12-02: 4 mg via INTRAVENOUS
  Filled 2017-12-02: qty 2

## 2017-12-02 NOTE — ED Provider Notes (Signed)
  Physical Exam  BP 114/68   Pulse 75   Temp 98.4 F (36.9 C) (Oral)   Resp 16   Ht 5' (1.524 m)   Wt 55.8 kg (123 lb)   SpO2 100%   BMI 24.02 kg/m   Physical Exam  ED Course/Procedures     Procedures  MDM  Patient with abdominal pain.  Dull lower abdomen.  Has known right inguinal hernia that is been there for years.  May have enlarged some but states that potentially has been over the last years.  No obstruction on CT scan and does have fibroids but does have new fluid collection hernia.  Discussed with patient about ultrasound to determine if this is some abnormality such as an ovary in there.  Does not appear obstructed.  Patient does not want to stay any longer states she has been here for many hours counting med center [facility].  We will follow-up with her primary care doctor who can further evaluate this.       Davonna Belling, MD 12/02/17 2229

## 2017-12-02 NOTE — ED Notes (Signed)
Pt verbalized understanding discharge instructions and denies any further needs or questions at this time. VS stable, ambulatory and steady gait.   

## 2017-12-02 NOTE — ED Triage Notes (Signed)
patient states that she is having lower abdominal pain - especially when she sits down. Denies any N/V/D - denies any burning with urination

## 2017-12-02 NOTE — ED Notes (Signed)
Pt BIB [facility] as transfer from [facility] for eval of nausea/vomiting and abd pain. Arrives dry heaving, no emesis. Pt transferred as CT scanner is down at [place] and requires imaging. Pt received 4 mg morphine, 4 mg zofran at [place] w/ minimal relief, but does not wish to have any more medication at this time

## 2017-12-02 NOTE — Discharge Instructions (Addendum)
Your hernia has enlarged since 9 years ago.  Follow-up with Dr. Claiborne Billings and potentially general surgery.  You may need further evaluation to figure out what is in the hernia.

## 2017-12-02 NOTE — ED Notes (Signed)
Patient transported to CT 

## 2017-12-02 NOTE — ED Provider Notes (Signed)
[facility] EMERGENCY DEPARTMENT Provider Note   CSN: [id number] Arrival date & time: 12/02/17  1547     History   Chief Complaint Chief Complaint  Patient presents with  . Abdominal Pain    HPI Megan Bowen is a 82 y.o. female.  Complaint is abdominal pain.  HPI: 82 year old female.  Started having lower abdominal pain in the midline about 11:00 this morning.  States she felt like it might of been gas.  It has worsened.  Rates it at 7/10.  No prior past similar episodes.  History of previous fibroid resection.  Not hysterectomy.  Colonoscopy showing diverticuli and resected polyps.  No urinary symptoms.  No flank pain.  Pain is not colicky or intermittent and does not localize.  History reviewed. No pertinent past medical history.  There are no active problems to display for this patient.   History reviewed. No pertinent surgical history.   OB History   None      Home Medications    Prior to Admission medications   Medication Sig Start Date End Date Taking? Authorizing Provider  Calcium Carbonate-Vit D-Min (RA CALCIUM 600/VIT D/MINERALS) 600-200 MG-UNIT TABS Take by mouth.    [provider]  cycloSPORINE (RESTASIS) 0.05 % ophthalmic emulsion 1 drop 2 (two) times daily.      [provider]  doxycycline (VIBRAMYCIN) 100 MG capsule Take 100 mg by mouth every other day.     [provider]  Magnesium 250 MG TABS Take by mouth.    [provider]  Multiple Vitamin (MULTIVITAMIN) capsule Take by mouth.    [provider]  zolpidem (AMBIEN) 5 MG tablet Take 2.5 mg by mouth.    [provider]    Family History History reviewed. No pertinent family history.  Social History Social History   Tobacco Use  . Smoking status: Never Smoker  . Smokeless tobacco: Never Used  Substance Use Topics  . Alcohol use: Yes  . Drug use: Not on file     Allergies   Amoxicillin; Demerol [meperidine]; and  Sulfa antibiotics   Review of Systems Review of Systems  Constitutional: Negative for appetite change, chills, diaphoresis, fatigue and fever.  HENT: Negative for mouth sores, sore throat and trouble swallowing.   Eyes: Negative for visual disturbance.  Respiratory: Negative for cough, chest tightness, shortness of breath and wheezing.   Cardiovascular: Negative for chest pain.  Gastrointestinal: Positive for abdominal pain. Negative for abdominal distention, diarrhea, nausea and vomiting.  Endocrine: Negative for polydipsia, polyphagia and polyuria.  Genitourinary: Negative for dysuria, frequency and hematuria.  Musculoskeletal: Negative for gait problem.  Skin: Negative for color change, pallor and rash.  Neurological: Negative for dizziness, syncope, light-headedness and headaches.  Hematological: Does not bruise/bleed easily.  Psychiatric/Behavioral: Negative for behavioral problems and confusion.     Physical Exam Updated Vital Signs BP (!) 182/79 (BP Location: Right Arm)   Pulse 84   Temp 98.1 F (36.7 C) (Oral)   Resp 18   Ht 5' (1.524 m)   Wt 55.8 kg (123 lb)   SpO2 100%   BMI 24.02 kg/m   Physical Exam  Constitutional: She is oriented to person, place, and time. She appears well-developed and well-nourished. No distress.  HENT:  Head: Normocephalic.  Eyes: Pupils are equal, round, and reactive to light. Conjunctivae are normal. No scleral icterus.  Neck: Normal range of motion. Neck supple. No thyromegaly present.  Cardiovascular: Normal rate and regular rhythm. Exam reveals no  gallop and no friction rub.  No murmur heard. Pulmonary/Chest: Effort normal and breath sounds normal. No respiratory distress. She has no wheezes. She has no rales.  Abdominal: Soft. Bowel sounds are normal. She exhibits no distension. There is tenderness. There is no rebound.  Tenderness throughout the midline and paramidline lower abdomen without peritoneal irritation.  She has a right  inguinal hernia that does not reduce but is not painful.  She states this is been present for more than 10 years.  Musculoskeletal: Normal range of motion.  Neurological: She is alert and oriented to person, place, and time.  Skin: Skin is warm and dry. No rash noted.  Psychiatric: She has a normal mood and affect. Her behavior is normal.     ED Treatments / Results  Labs (all labs ordered are listed, but only abnormal results are displayed) Labs Reviewed  URINALYSIS, ROUTINE W REFLEX MICROSCOPIC - Abnormal; Notable for the following components:      Result Value   Hgb urine dipstick MODERATE (*)    All other components within normal limits  COMPREHENSIVE METABOLIC PANEL - Abnormal; Notable for the following components:   Sodium 133 (*)    Chloride 97 (*)    Glucose, Bld 109 (*)    All other components within normal limits  URINALYSIS, MICROSCOPIC (REFLEX) - Abnormal; Notable for the following components:   Bacteria, UA RARE (*)    Squamous Epithelial / LPF 0-5 (*)    All other components within normal limits  CBC WITH DIFFERENTIAL/PLATELET    EKG None  Radiology No results found.  Procedures Procedures (including critical care time)  Medications Ordered in ED Medications  morphine 2 MG/ML injection 2 mg (2 mg Intravenous Given 12/02/17 1637)  ondansetron (ZOFRAN) injection 4 mg (4 mg Intravenous Given 12/02/17 1637)     Initial Impression / Assessment and Plan / ED Course  I have reviewed the triage vital signs and the nursing notes.  Pertinent labs & imaging results that were available during my care of the patient were reviewed by me and considered in my medical decision making (see chart for details).    Concern for diverticuli.  This is low abdominal and not concerning for aneurysm.  She has microscopic hematuria but does not appear distressed or in the amount of pain I would expect with colic from ureteral stone.  Our CT scan at [facility] and [place] is currently  unavailable-not functional.  I discussed the case with partner Dr. Eulis Foster at [facility].  Patient will be transferred for CT imaging to rule out diverticular complication.  Discussed with patient that this may possibly be uncomplicated diverticulitis in which case she may be eligible for discharge home.  Final Clinical Impressions(s) / ED Diagnoses   Final diagnoses:  Lower abdominal pain    ED Discharge Orders    None       Tanna Furry, MD 12/02/17 1726

## 2018-04-27 ENCOUNTER — Other Ambulatory Visit (HOSPITAL_BASED_OUTPATIENT_CLINIC_OR_DEPARTMENT_OTHER): Payer: Self-pay | Admitting: Family Medicine

## 2018-04-27 DIAGNOSIS — Z1231 Encounter for screening mammogram for malignant neoplasm of breast: Secondary | ICD-10-CM

## 2018-05-14 ENCOUNTER — Ambulatory Visit (HOSPITAL_BASED_OUTPATIENT_CLINIC_OR_DEPARTMENT_OTHER): Payer: Medicare Other

## 2018-05-21 ENCOUNTER — Ambulatory Visit (HOSPITAL_BASED_OUTPATIENT_CLINIC_OR_DEPARTMENT_OTHER)
Admission: RE | Admit: 2018-05-21 | Discharge: 2018-05-21 | Disposition: A | Discharge status: Home / Self Care | Payer: Medicare Other | Source: Ambulatory Visit | Attending: Family Medicine | Admitting: Family Medicine

## 2018-05-21 ENCOUNTER — Other Ambulatory Visit (HOSPITAL_BASED_OUTPATIENT_CLINIC_OR_DEPARTMENT_OTHER): Payer: Self-pay | Admitting: Family Medicine

## 2018-05-21 DIAGNOSIS — Z1231 Encounter for screening mammogram for malignant neoplasm of breast: Secondary | ICD-10-CM | POA: Diagnosis not present

## 2018-10-10 HISTORY — PX: HERNIA REPAIR: SHX51

## 2019-05-03 ENCOUNTER — Telehealth: Payer: Self-pay

## 2019-05-03 NOTE — Telephone Encounter (Signed)
Spoke to pt. Offered her a sooner appt on Wed, 8-26 at 4pm per [place]. She is not sure she can come then. Will check and call us back.  Put hold on spot.   Pt called back and can come on Wednesday, the 26th at 4:00pm.  Rescheduled appt.

## 2019-05-03 NOTE — Telephone Encounter (Signed)
-----   Message from Yetta Flock, MD sent at 05/03/2019  7:42 AM EDT ----- Megan Bowen, I'm trying to accommodate this patient and get her a sooner appointment with me in the clinic than what she already has. Let's look at the schedule and see what we can do, will talk with you about it. Thanks   ----- Message ----- From: Delos Haring, RN Sent: 05/01/2019   7:57 AM EDT To: Yetta Flock, MD  Dr. Havery Moros,  Here is the name of the patient you asked me to send you a staff message about. I will let her and her friend know that you may try to get her in earlier.  Thank you, Olivia Mackie

## 2019-05-05 ENCOUNTER — Encounter (INDEPENDENT_AMBULATORY_CARE_PROVIDER_SITE_OTHER): Payer: Self-pay

## 2019-05-05 ENCOUNTER — Encounter: Payer: Self-pay | Admitting: Gastroenterology

## 2019-05-05 ENCOUNTER — Ambulatory Visit (INDEPENDENT_AMBULATORY_CARE_PROVIDER_SITE_OTHER): Payer: Medicare Other | Admitting: Gastroenterology

## 2019-05-05 VITALS — BP 128/72 | HR 80 | Temp 97.8°F | Ht 61.75 in | Wt 115.0 lb

## 2019-05-05 DIAGNOSIS — R195 Other fecal abnormalities: Secondary | ICD-10-CM

## 2019-05-05 MED ORDER — SUPREP BOWEL PREP KIT 17.5-3.13-1.6 GM/177ML PO SOLN: ORAL | 0 refills | Status: DC

## 2019-05-05 NOTE — Patient Instructions (Addendum)
If you are age 81 or older, your body mass index should be between 23-30. Your Body mass index is 21.2 kg/m. If this is out of the aforementioned range listed, please consider follow up with your Primary Care Provider.  To help prevent the possible spread of infection to our patients, communities, and staff; we will be implementing the following measures:  As of now we are not allowing any visitors/family members to accompany you to any upcoming appointments with [facility]. If you have any concerns about this please contact our office to discuss prior to the appointment.   You have been scheduled for a colonoscopy. Please follow written instructions given to you at your visit today.  Please pick up your prep supplies at the pharmacy within the next 1-3 days. If you use inhalers (even only as needed), please bring them with you on the day of your procedure. Your physician has requested that you go to www.startemmi.com and enter the access code given to you at your visit today. This web site gives a general overview about your procedure. However, you should still follow specific instructions given to you by our office regarding your preparation for the procedure.  Thank you for entrusting me with your care and for choosing [facility], Dr. Sanborn Cellar

## 2019-05-05 NOTE — Progress Notes (Signed)
HPI :  83 year old female with a history of reported colon polyps, diverticulosis, tortuous colon, referred here by Thornton Dales MD for heme positive stool.   The patient had a positive stool test - iFOB on July 23rd.  She denies any overt blood in her stools.  She denies any problems with her bowel habits, no diarrhea or constipation.  No abdominal pains.  She states she eats a very high-fiber diet, she is a vegetarian, has never had any issues with her bowels.  She denies any problems with reflux, no dysphagia, no abdominal pains.  She states she has an extremely tortuous colon, she states she has had a history of a polyp removed from her colon remotely, and has had a total of 3 colonoscopies in the past, however she thinks all of them have been incomplete with inability to reach the right colon.  Her last colonoscopy was June 2013, the right colon could not be intubated.  She had diverticulosis noted but no polyps she states she woke up during the procedure and had severe abdominal pain.   She reports she had a bowel obstruction from an inguinal hernia in 02/26/2018.  She had emergent surgery for that.  She then had mesh repair of the hernia in February of this year.  She states she is been doing pretty well with this.  No abdominal pain.  She is quite anxious about what the stool test means.  She is anxious about waking up during a colonoscopy and asks about options.  She was seen by [facility] on August 18.  They scheduled her for colonoscopy in December, she was anxious about waiting that long and wanted another opinion.    Past Medical History:  Diagnosis Date  . Colon polyps 1990  . Difficulty sleeping   . Diverticulitis   . Hemorrhoids, internal   . Muscle cramps   . Urinary frequency      Past Surgical History:  Procedure Laterality Date  . HERNIA REPAIR  10/2018   History reviewed. No pertinent family history. Social History   Tobacco Use  .  Smoking status: Never Smoker  . Smokeless tobacco: Never Used  Substance Use Topics  . Alcohol use: Never    Frequency: Never  . Drug use: Never   Current Outpatient Medications  Medication Sig Dispense Refill  . Calcium Carbonate-Vit D-Min (RA CALCIUM 600/VIT D/MINERALS) 600-200 MG-UNIT TABS Take by mouth.    . Magnesium 250 MG TABS Take by mouth.    . Multiple Vitamin (MULTIVITAMIN) capsule Take by mouth.    . Potassium Gluconate 550 (90 K) MG TABS Take 550 mg by mouth daily.    Marland Kitchen zolpidem (AMBIEN) 5 MG tablet Take 2.5 mg by mouth.    . cycloSPORINE (RESTASIS) 0.05 % ophthalmic emulsion 1 drop 2 (two) times daily.      Marland Kitchen doxycycline (VIBRAMYCIN) 100 MG capsule Take 100 mg by mouth every other day.     . traMADol (ULTRAM) 50 MG tablet Take 1 tablet (50 mg total) by mouth every 6 (six) hours as needed. 8 tablet 0   No current facility-administered medications for this visit.    Allergies  Allergen Reactions  . Amoxicillin Nausea Only  . Demerol [Meperidine]   . Sulfa Antibiotics      Review of Systems: All systems reviewed and negative except where noted in HPI.   Lab Results  Component Value Date   WBC 8.9 12/02/2017   HGB 13.7 12/02/2017  HCT 38.2 12/02/2017   MCV 92.7 12/02/2017   PLT 185 12/02/2017    Lab Results  Component Value Date   CREATININE 0.68 12/02/2017   BUN 19 12/02/2017   NA 133 (L) 12/02/2017   K 4.0 12/02/2017   CL 97 (L) 12/02/2017   CO2 27 12/02/2017    Lab Results  Component Value Date   ALT 18 12/02/2017   AST 21 12/02/2017   ALKPHOS 80 12/02/2017   BILITOT 0.7 12/02/2017     Physical Exam: BP 128/72 (BP Location: Left Arm, Patient Position: Sitting, Cuff Size: Normal)   Pulse 80   Temp 97.8 F (36.6 C) (Other (Comment))   Ht 5' 1.75" (1.568 m)   Wt 115 lb (52.2 kg)   BMI 21.20 kg/m  Constitutional: Pleasant,well-developed, female in no acute distress. HEENT: Normocephalic and atraumatic. Conjunctivae are normal. No scleral  icterus. Neck supple.  Cardiovascular: Normal rate, regular rhythm.  Pulmonary/chest: Effort normal and breath sounds normal. No wheezing, rales or rhonchi. Abdominal: Soft, nondistended, nontender. There are no masses palpable. No hepatomegaly. Extremities: no edema Lymphadenopathy: No cervical adenopathy noted. Neurological: Alert and oriented to person place and time. Skin: Skin is warm and dry. No rashes noted. Psychiatric: Normal mood and affect. Behavior is normal.   ASSESSMENT AND PLAN: 83 year old female here for new patient assessment of the following:  Heme positive stool - patient is very healthy for her age and asymptomatic without anemia, she's quite anxious about the result of her stool test.  I discussed differential for her positive stool test, and a colonoscopy is recommended to further evaluate, ensure no evidence of colon cancer or advanced polyp, and especially clear her right colon given inability to evaluate this on prior colonoscopy exams.  We discussed what colonoscopy is at length including risks and benefits.  I also discussed alternatives with her to include virtual colonoscopy. We discussed these options at length, risks / benefits of each, as well as the option of not doing any further evaluation now if she did not want an intervention such as surgery performed should she have colon cancer or other concerning pathology. She is very concerned about waking up during the procedure and not being able to complete the exam.  Given her prior failed colonoscopies it sounds like she has a very tortuous colon and cecal intubation may not be possible but we can clear as much of her colon as possible in light of this test to provide reassurance for her. I am confident we can keep her comfortable throughout the exam with anesthesia and reassured her. After a lengthy discussion about all of these options and issues, she wanted to proceed with the colonoscopy, to be done at the [facility] at  her convenience in the next week or so. Further recommendations pending the results.   Bergholz Cellar, MD [facility]  CC: Lauraine Rinne, MD

## 2019-05-06 ENCOUNTER — Telehealth: Payer: Self-pay | Admitting: Gastroenterology

## 2019-05-06 ENCOUNTER — Encounter: Payer: Self-pay | Admitting: Gastroenterology

## 2019-05-06 IMAGING — CR DG ABDOMEN ACUTE W/ 1V CHEST
4 series · 4 of 4 positions shown · non-contrast
Comparison: 12/26/2009 CT 11/29/2008

CLINICAL DATA: Lower abdominal pain for 1 day

EXAM:
DG ABDOMEN ACUTE W/ 1V CHEST

[w chest pa]
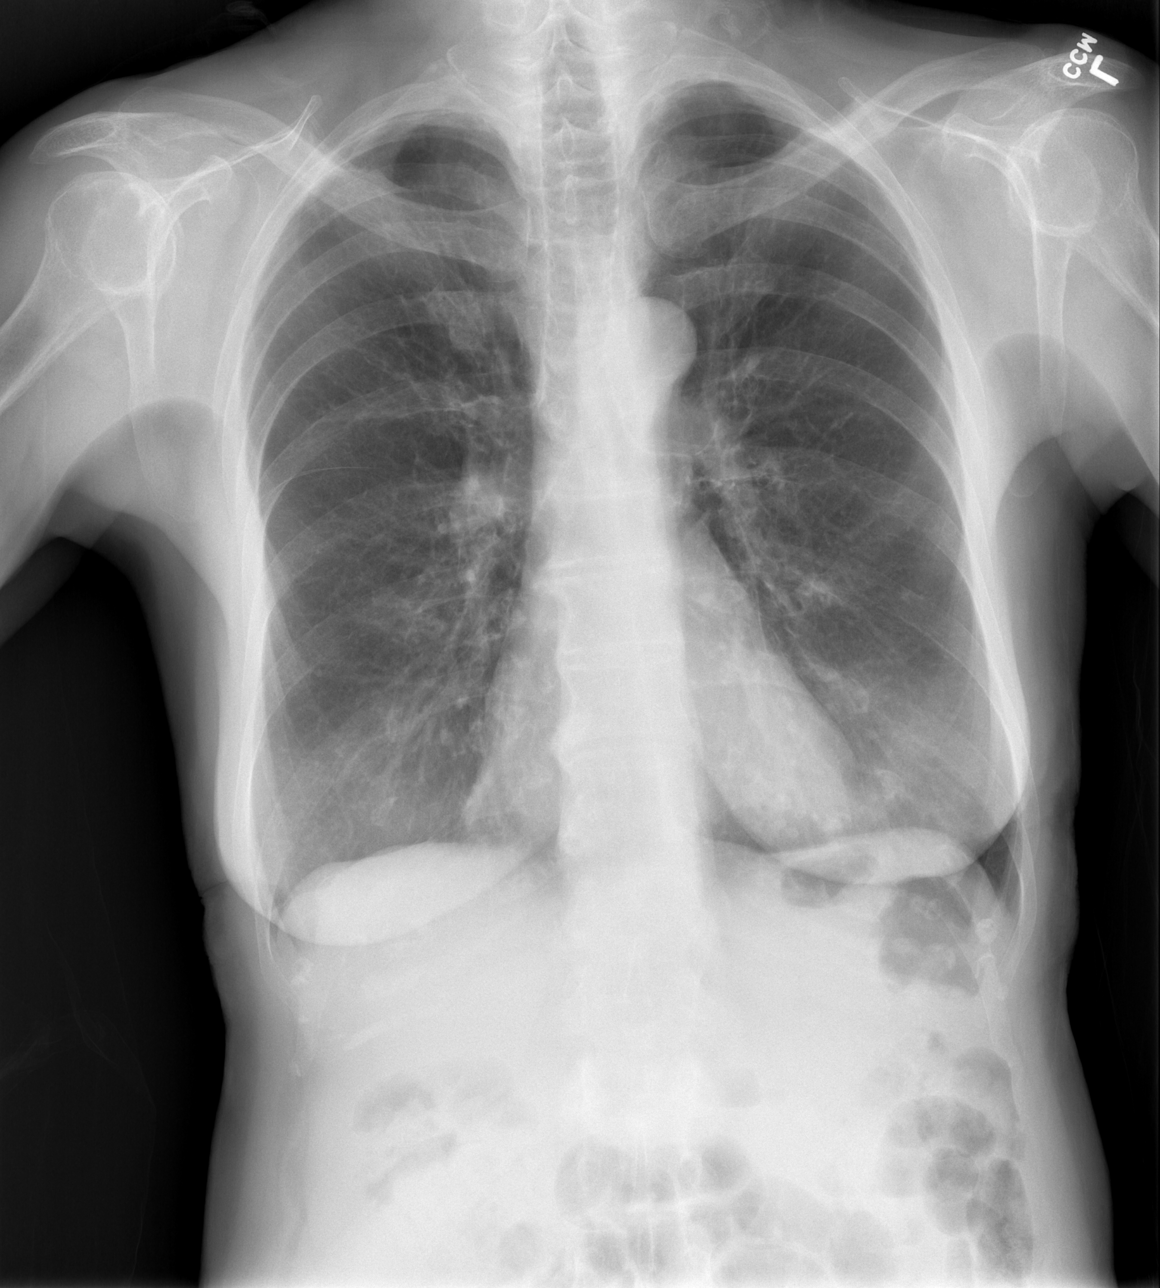

[w abdomen upright]
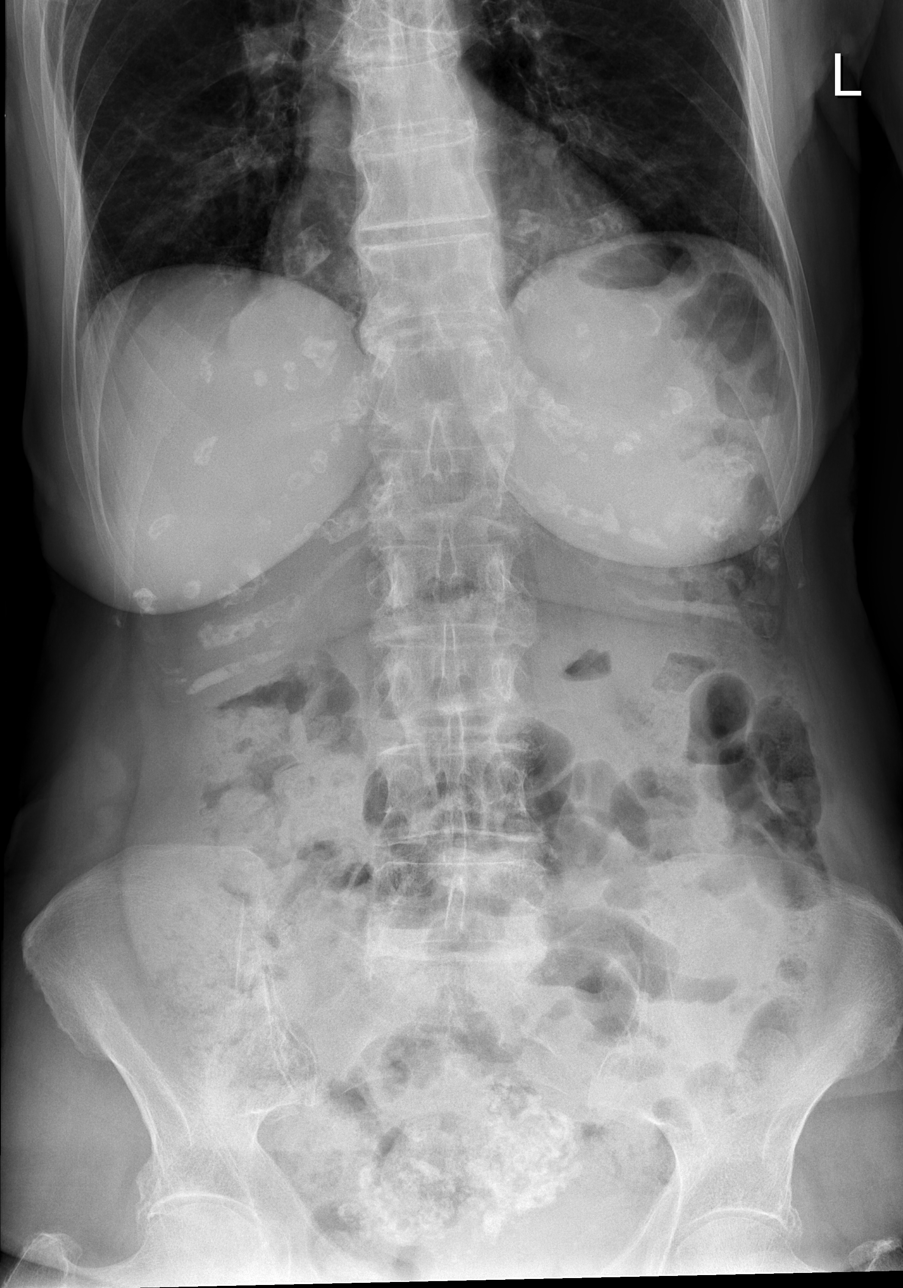

[t abdomen supine (1 of 2)]
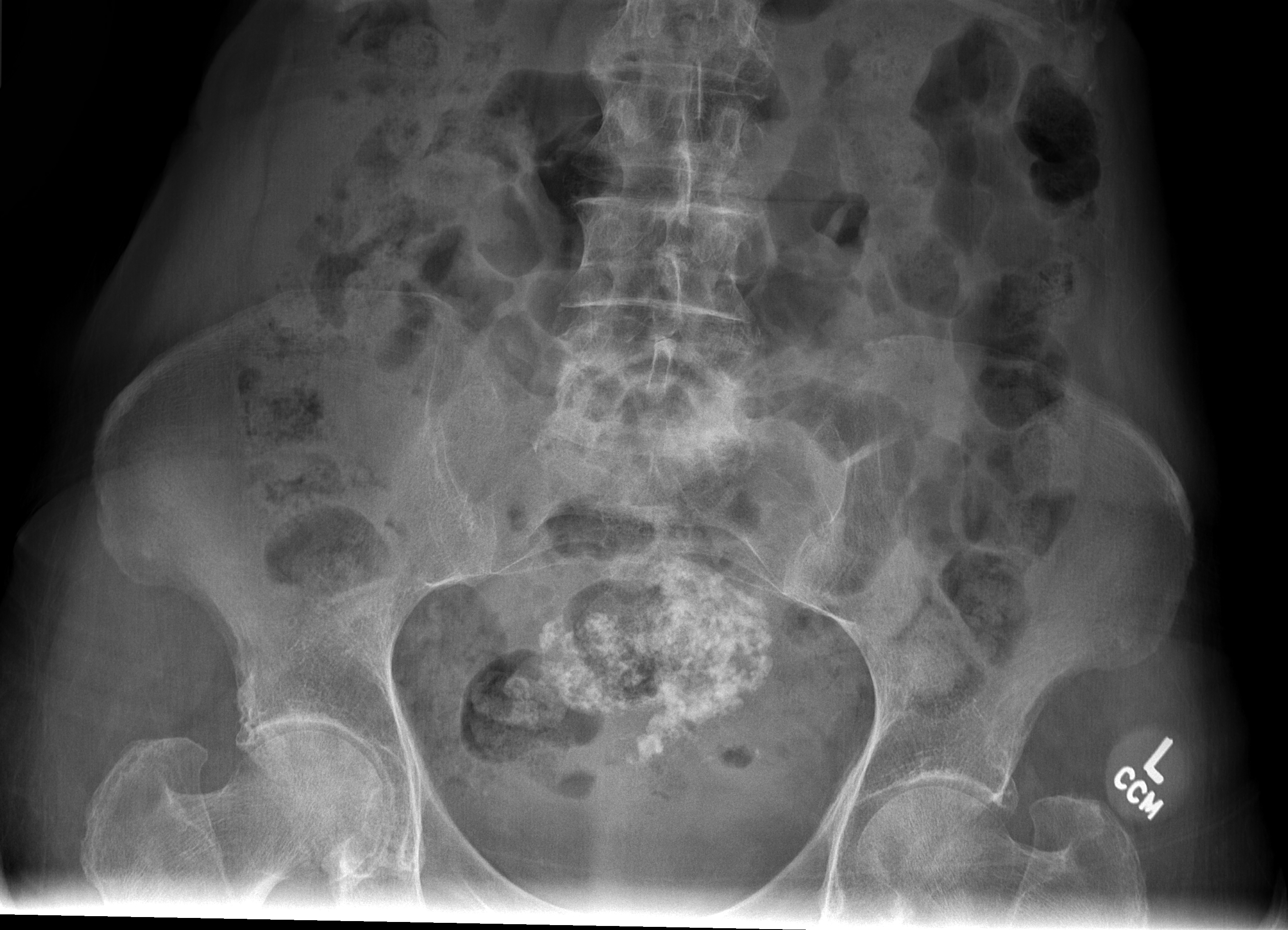

[t abdomen supine (2 of 2)]
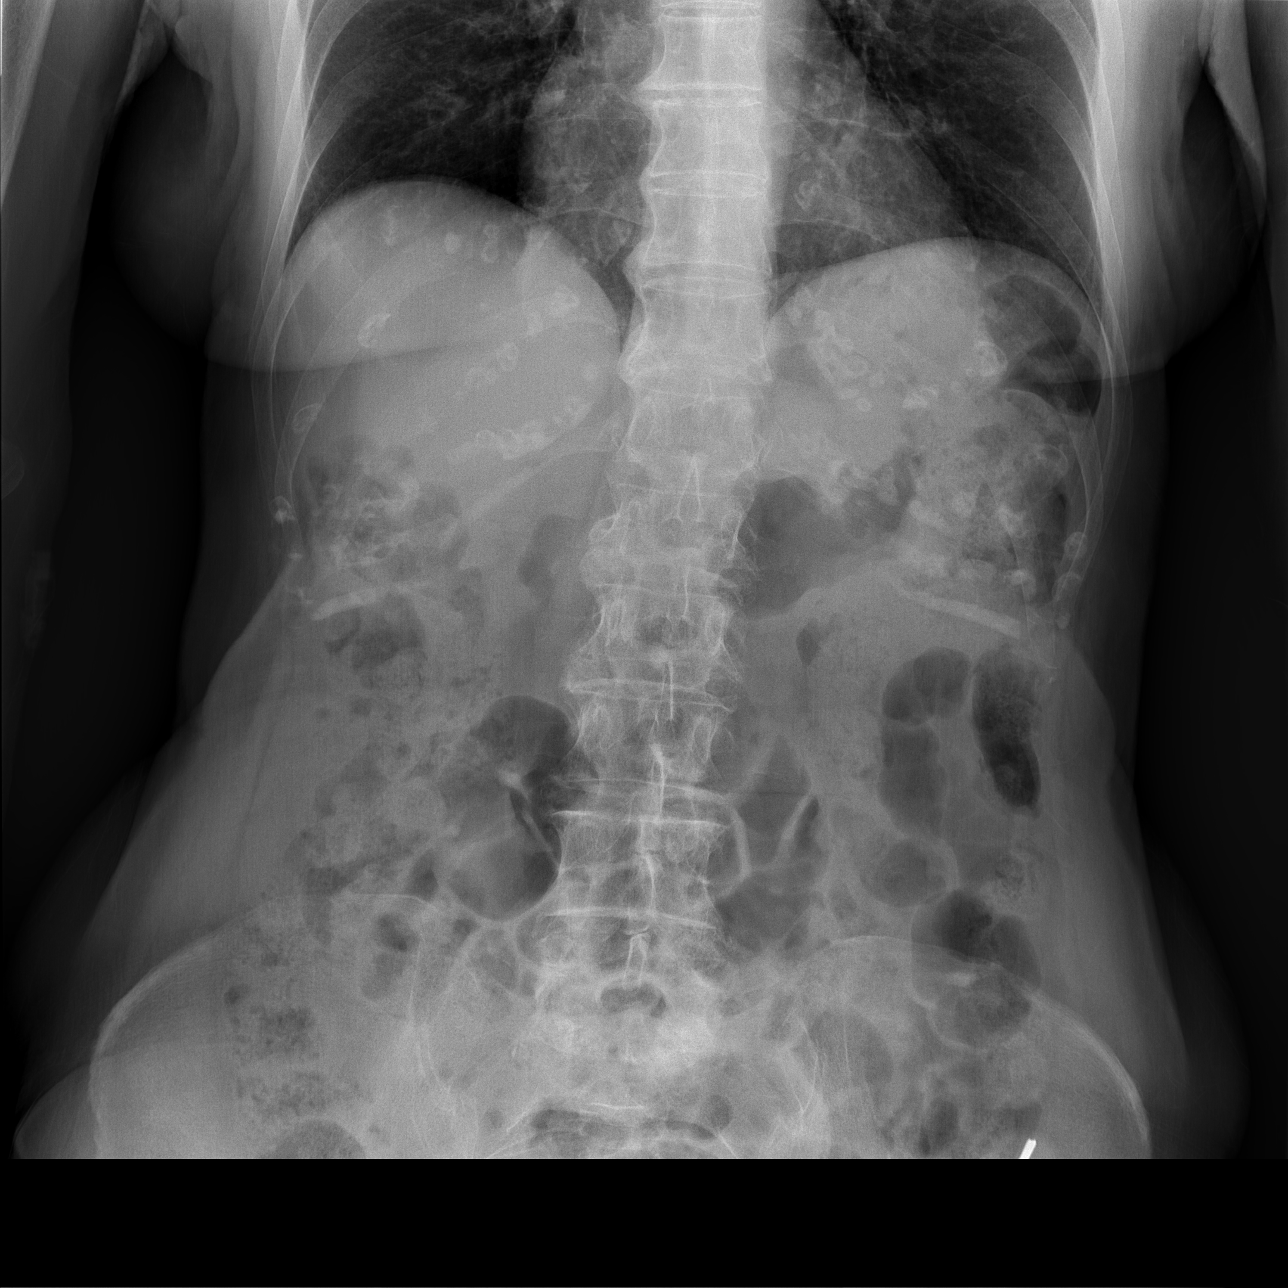

[4 of 4 positions shown; findings below may reference images not displayed]

FINDINGS: Single-view chest demonstrates no focal opacity or pleural effusion.
Cardiomediastinal silhouette within normal limits.

Supine and upright views of the abdomen demonstrate no free air
beneath the diaphragm. Nonobstructed bowel-gas pattern with
increased stool. Coarse calcifications in the pelvis consistent with
fibroids.
IMPRESSION: Nonobstructed bowel gas pattern with large amount of stool in the
colon. No acute cardiopulmonary disease. Calcified fibroids.

## 2019-05-06 IMAGING — CT CT ABD-PELV W/ CM
2 of 5 series · 16 of 46 positions shown, 18 images · IV contrast (APPLIED)
Comparison: Radiograph 12/02/2017, CT 11/29/2008

CLINICAL DATA: Lower abdominal pain

EXAM:
CT ABDOMEN AND PELVIS WITH CONTRAST
TECHNIQUE: Multidetector CT imaging of the abdomen and pelvis was performed
using the standard protocol following bolus administration of
intravenous contrast.
CONTRAST:  100 mL Msovue-YFF intravenous

[Series 3: abd/ pelvis 5.0 i30f 2 · axial · 0.64mm/px · z∈[+829,+1179]mm · 13 of 81 slices shown, 15 images]
[im 6/81  soft-tissue]
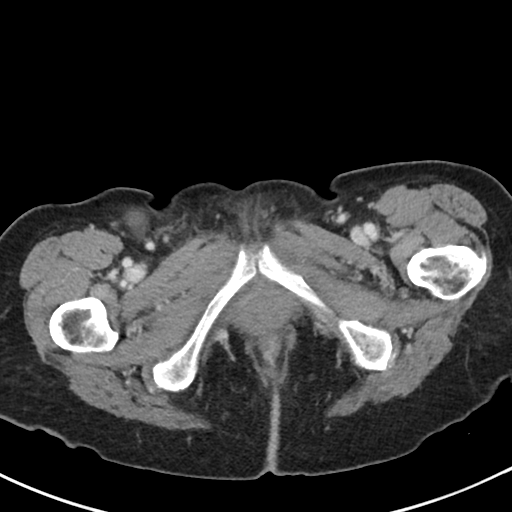
[im 6/81  bone]
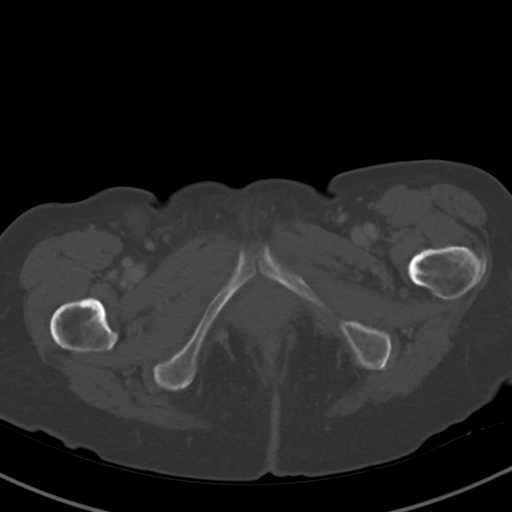
[im 11/81  soft-tissue]
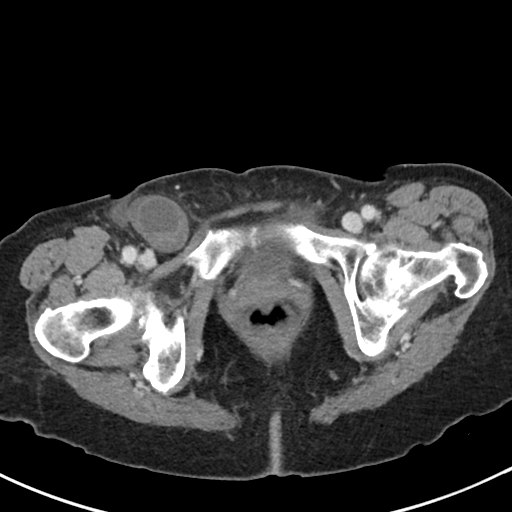
[im 16/81  soft-tissue]
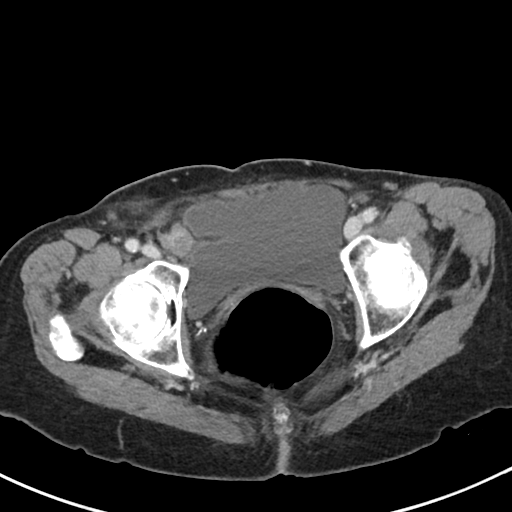
[im 26/81  soft-tissue]
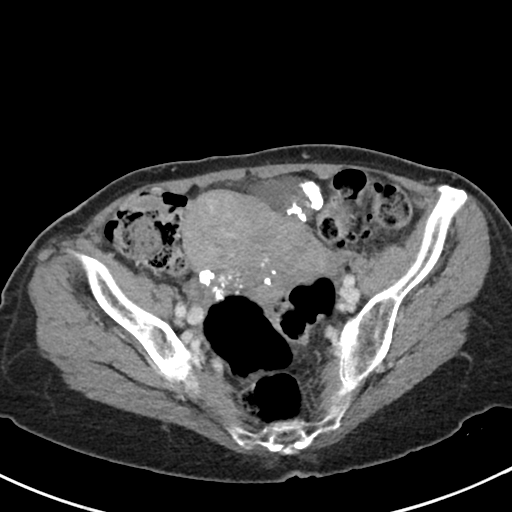
[im 31/81  soft-tissue]
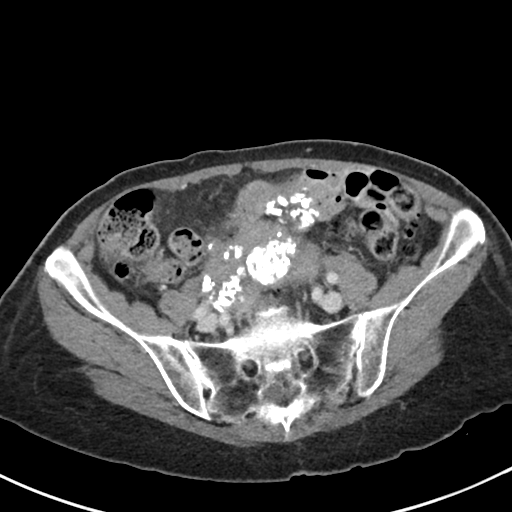
[im 36/81  soft-tissue]
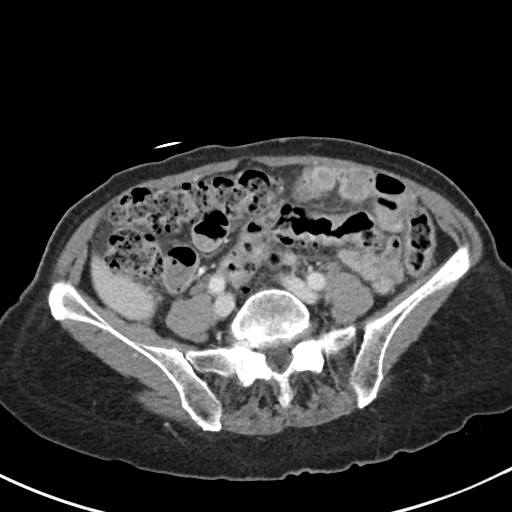
[im 41/81  soft-tissue]
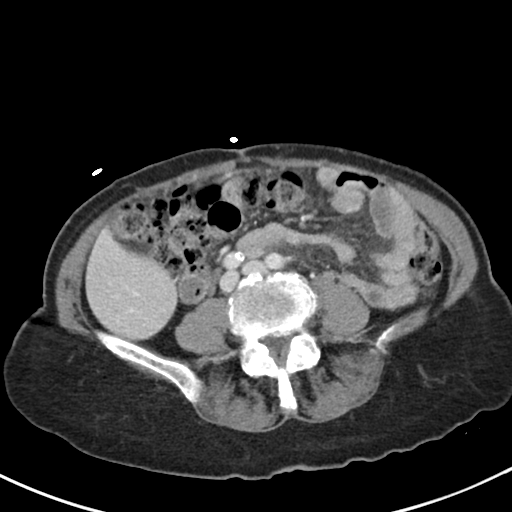
[im 46/81  soft-tissue]
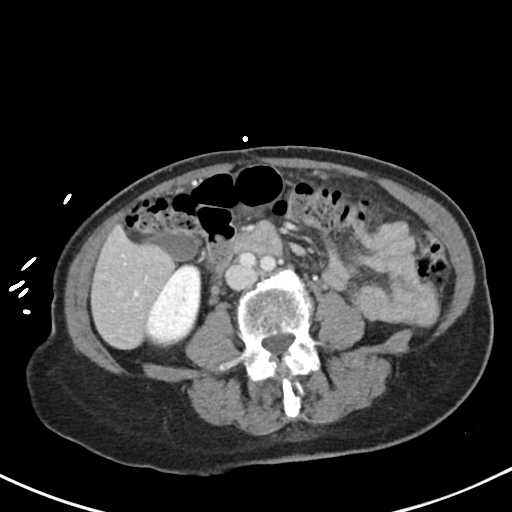
[im 51/81  soft-tissue]
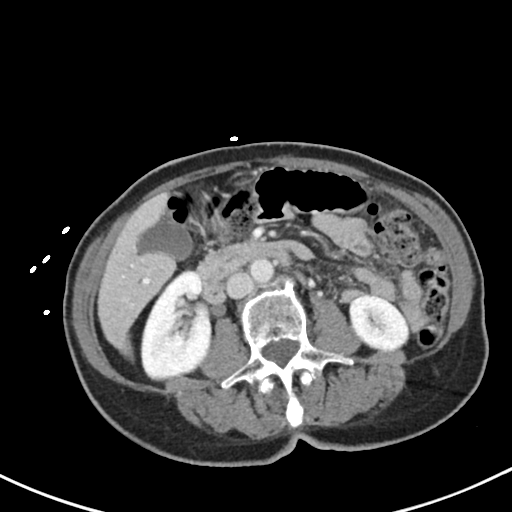
[im 51/81  bone]
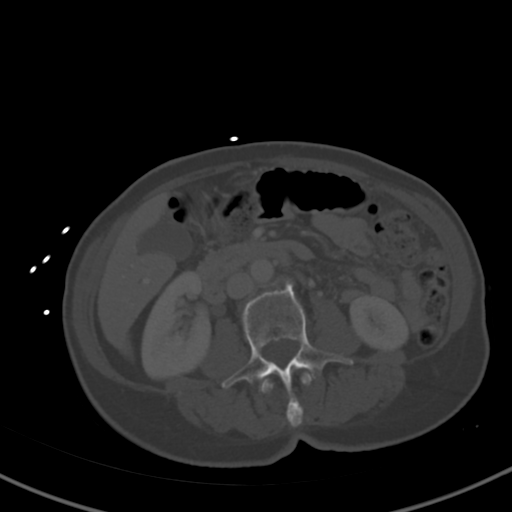
[im 56/81  soft-tissue]
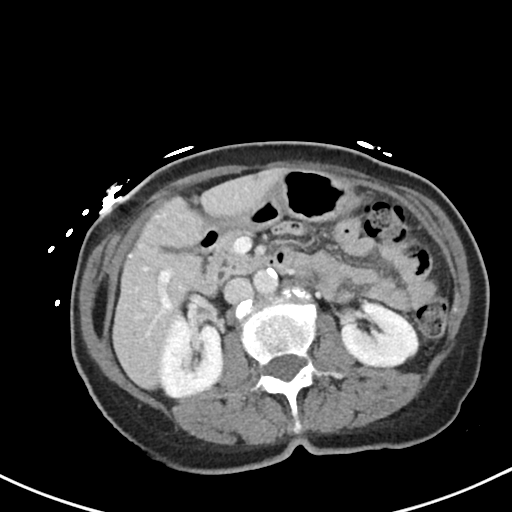
[im 66/81  soft-tissue]
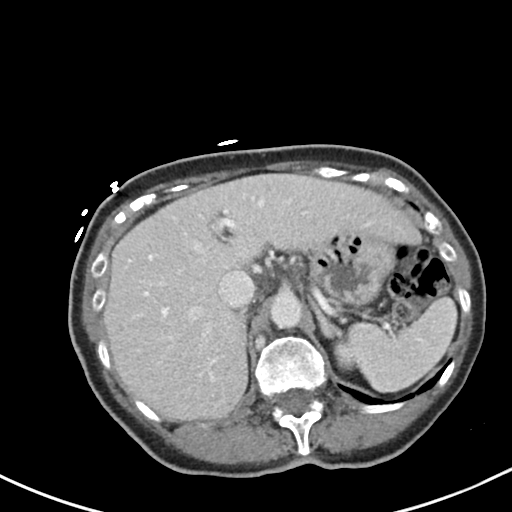
[im 71/81  soft-tissue]
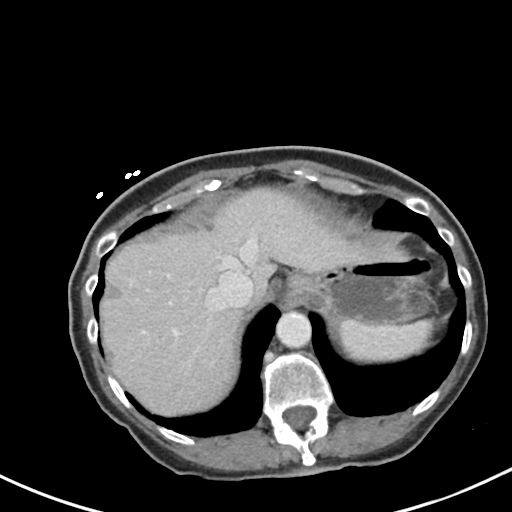
[im 76/81  soft-tissue]
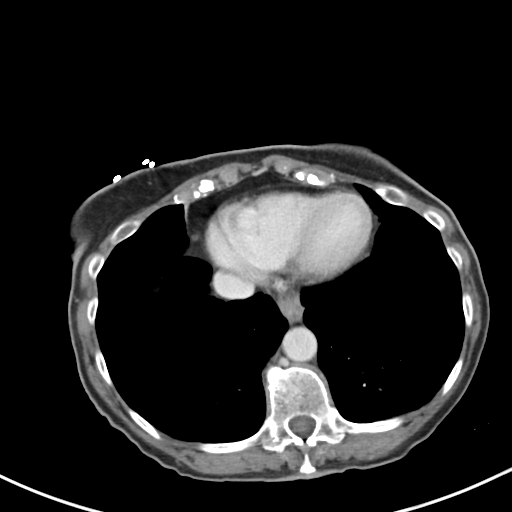

[Series 6: coronal soft tissue · coronal · 0.59mm/px · 3 of 70 slices shown]
[im 24/70  soft-tissue]
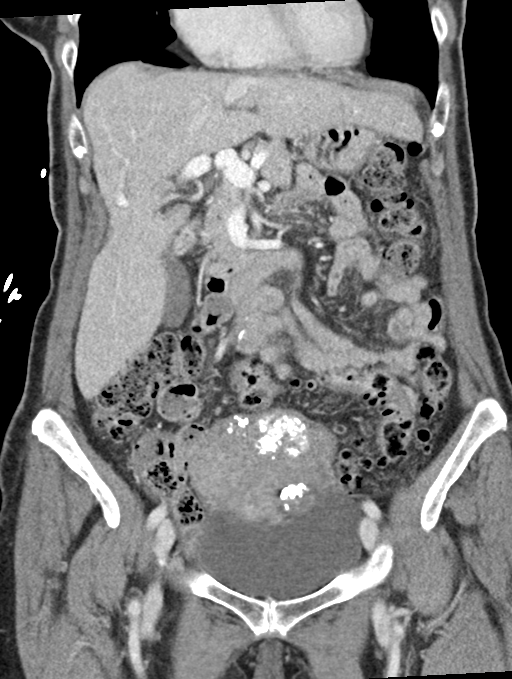
[im 31/70  soft-tissue]
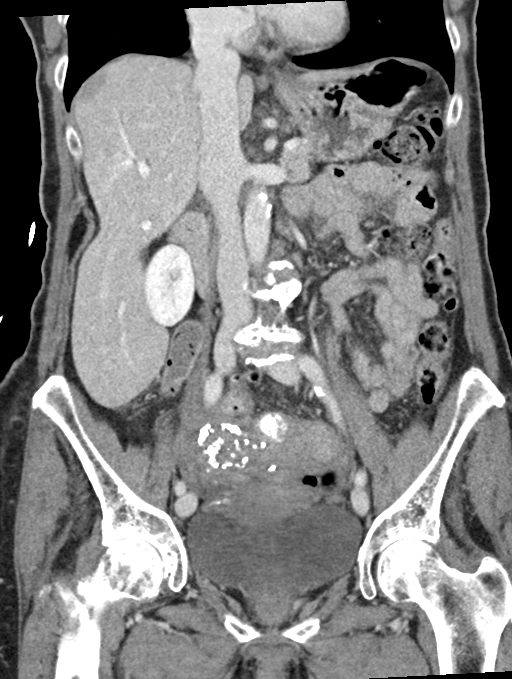
[im 39/70  soft-tissue]
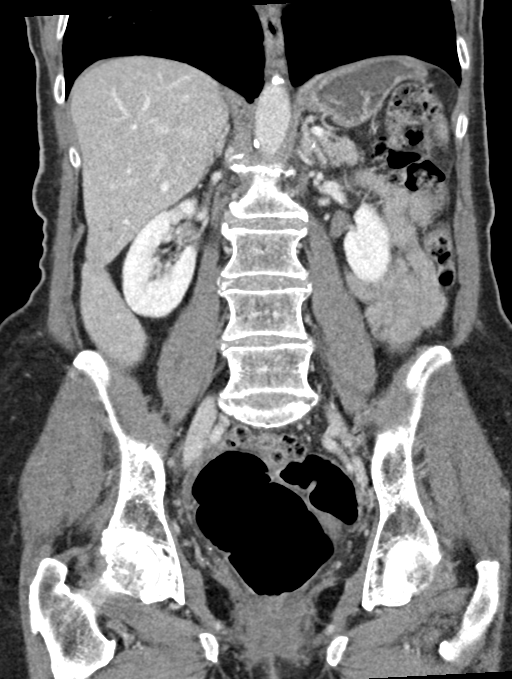

[16 of 46 positions shown; findings below may reference images not displayed]

FINDINGS: Lower chest: Lung bases demonstrate no acute consolidation or
effusion. Heart size within normal limits.

Hepatobiliary: No focal liver abnormality is seen. No gallstones,
gallbladder wall thickening, or biliary dilatation.

Pancreas: Unremarkable. No pancreatic ductal dilatation or
surrounding inflammatory changes.

Spleen: Normal in size without focal abnormality.

Adrenals/Urinary Tract: Adrenal glands are within normal limits.
Kidneys show no hydronephrosis. Bladder unremarkable.

Stomach/Bowel: Stomach nonenlarged. No dilated small bowel. No colon
wall thickening.

Vascular/Lymphatic: Moderate aortic atherosclerosis. No aneurysmal
dilatation. No significantly enlarged lymph nodes

Reproductive: Enlarged uterus with multiple masses and chunky
calcifications consistent with fibroids.

Other: Negative for free air or free fluid. Ovoid fluid collection
within the right groin was noted previously on comparison CT.
However there is interim finding of cystic and soft tissue density
superior to the fluid collection.

Musculoskeletal: Degenerative changes. No acute or suspicious
abnormality
IMPRESSION: 1. Chronic oval fluid collection in the right inguinal region with
interim finding of cystic and soft tissue density superior to the
right groin fluid collection. This does not have typical appearance
for bowel containing hernia. Although rare, could consider
ovarian/adnexal hernia, possibly with incarceration.
2. There are otherwise no acute abnormalities visualize within the
abdomen and pelvis
3. Enlarged uterus with multiple masses/fibroids.

## 2019-05-06 NOTE — Telephone Encounter (Signed)
Called and spoke to pt.  Answered all questions about what she can and cannot have the 5 days prior to her procedure such as cooked vegetables and fruits without seeds.

## 2019-05-06 NOTE — Telephone Encounter (Signed)
Pt requested a call back regarding prep instructions.

## 2019-05-13 ENCOUNTER — Telehealth: Payer: Self-pay

## 2019-05-13 NOTE — Telephone Encounter (Signed)
Covid-19 screening questions   Do you now or have you had a fever in the last 14 days? NO   Do you have any respiratory symptoms of shortness of breath or cough now or in the last 14 days? NO  Do you have any family members or close contacts with diagnosed or suspected Covid-19 in the past 14 days? NO  Have you been tested for Covid-19 and found to be positive? NO        

## 2019-05-14 ENCOUNTER — Other Ambulatory Visit: Payer: Self-pay

## 2019-05-14 ENCOUNTER — Ambulatory Visit (AMBULATORY_SURGERY_CENTER): Payer: Medicare Other | Admitting: Gastroenterology

## 2019-05-14 ENCOUNTER — Encounter: Payer: Self-pay | Admitting: Gastroenterology

## 2019-05-14 VITALS — BP 150/70 | HR 64 | Temp 97.8°F | Resp 20 | Ht 61.5 in | Wt 115.0 lb

## 2019-05-14 DIAGNOSIS — D125 Benign neoplasm of sigmoid colon: Secondary | ICD-10-CM

## 2019-05-14 DIAGNOSIS — R195 Other fecal abnormalities: Secondary | ICD-10-CM

## 2019-05-14 DIAGNOSIS — D123 Benign neoplasm of transverse colon: Secondary | ICD-10-CM

## 2019-05-14 DIAGNOSIS — K5730 Diverticulosis of large intestine without perforation or abscess without bleeding: Secondary | ICD-10-CM

## 2019-05-14 MED ORDER — SODIUM CHLORIDE 0.9 % IV SOLN: 500.0000 mL | Freq: Once | INTRAVENOUS | Status: DC

## 2019-05-14 NOTE — Patient Instructions (Signed)
YOU HAD AN ENDOSCOPIC PROCEDURE TODAY AT THE [facility]:   Refer to the procedure report that was given to you for any specific questions about what was found during the examination.  If the procedure report does not answer your questions, please call your gastroenterologist to clarify.  If you requested that your care partner not be given the details of your procedure findings, then the procedure report has been included in a sealed envelope for you to review at your convenience later.  YOU SHOULD EXPECT: Some feelings of bloating in the abdomen. Passage of more gas than usual.  Walking can help get rid of the air that was put into your GI tract during the procedure and reduce the bloating. If you had a lower endoscopy (such as a colonoscopy or flexible sigmoidoscopy) you may notice spotting of blood in your stool or on the toilet paper. If you underwent a bowel prep for your procedure, you may not have a normal bowel movement for a few days.  Please Note:  You might notice some irritation and congestion in your nose or some drainage.  This is from the oxygen used during your procedure.  There is no need for concern and it should clear up in a day or so.  SYMPTOMS TO REPORT IMMEDIATELY:   Following lower endoscopy (colonoscopy or flexible sigmoidoscopy):  Excessive amounts of blood in the stool  Significant tenderness or worsening of abdominal pains  Swelling of the abdomen that is new, acute  Fever of 100F or higher   For urgent or emergent issues, a gastroenterologist can be reached at any hour by calling [phone].   DIET:  We do recommend a small meal at first, but then you may proceed to your regular diet.  Drink plenty of fluids but you should avoid alcoholic beverages for 24 hours.  MEDICATIONS: Continue present medications.  Please see handouts given to you by your recovery nurse.  ACTIVITY:  You should plan to take it easy for the rest of today and you should  NOT DRIVE or use heavy machinery until tomorrow (because of the sedation medicines used during the test).    FOLLOW UP: Our staff will call the number listed on your records 48-72 hours following your procedure to check on you and address any questions or concerns that you may have regarding the information given to you following your procedure. If we do not reach you, we will leave a message.  We will attempt to reach you two times.  During this call, we will ask if you have developed any symptoms of COVID 19. If you develop any symptoms (ie: fever, flu-like symptoms, shortness of breath, cough etc.) before then, please call [phone].  If you test positive for Covid 19 in the 2 weeks post procedure, please call and report this information to us.    If any biopsies were taken you will be contacted by phone or by letter within the next 1-3 weeks.  Please call us at [phone] if you have not heard about the biopsies in 3 weeks.   Thank you for allowing us to provide for your healthcare needs today.   SIGNATURES/CONFIDENTIALITY: You and/or your care partner have signed paperwork which will be entered into your electronic medical record.  These signatures attest to the fact that that the information above on your After Visit Summary has been reviewed and is understood.  Full responsibility of the confidentiality of this discharge information lies with you and/or   your care-partner. 

## 2019-05-14 NOTE — Progress Notes (Signed)
A/ox3, pleased with MAC, report to RN 

## 2019-05-14 NOTE — Progress Notes (Signed)
Called to room to assist during endoscopic procedure.  Patient ID and intended procedure confirmed with present staff. Received instructions for my participation in the procedure from the performing physician.  

## 2019-05-14 NOTE — Op Note (Signed)
[facility] Patient Name: Megan Bowen Procedure Date: 05/14/2019 11:22 AM MRN: [id number] Endoscopist: Remo Lipps P. Havery Moros , MD Age: 83 Referring MD:  Date of Birth: [birth year] Gender: Female Account #: [id number] Procedure:                Colonoscopy Indications:              Heme positive stool, multiple incomplete                            colonoscopies in the past due to tortousity Medicines:                Monitored Anesthesia Care Procedure:                Pre-Anesthesia Assessment:                           - Prior to the procedure, a History and Physical                            was performed, and patient medications and                            allergies were reviewed. The patient's tolerance of                            previous anesthesia was also reviewed. The risks                            and benefits of the procedure and the sedation                            options and risks were discussed with the patient.                            All questions were answered, and informed consent                            was obtained. Prior Anticoagulants: The patient has                            taken no previous anticoagulant or antiplatelet                            agents. ASA Grade Assessment: II - A patient with                            mild systemic disease. After reviewing the risks                            and benefits, the patient was deemed in                            satisfactory condition to undergo the procedure.  After obtaining informed consent, the colonoscope                            was passed under direct vision. Throughout the                            procedure, the patient's blood pressure, pulse, and                            oxygen saturations were monitored continuously. The                            Colonoscope was introduced through the anus and                            advanced to the the  cecum, identified by                            appendiceal orifice and ileocecal valve. The                            colonoscopy was performed without difficulty. The                            patient tolerated the procedure well. The quality                            of the bowel preparation was adequate. The                            ileocecal valve, appendiceal orifice, and rectum                            were photographed. Scope In: 11:26:54 AM Scope Out: 12:08:44 PM Scope Withdrawal Time: 0 hours 31 minutes 35 seconds  Total Procedure Duration: 0 hours 41 minutes 50 seconds  Findings:                 The perianal and digital rectal examinations were                            normal.                           Many small and large-mouthed diverticula were found                            in the entire colon.                           Four sessile polyps were found in the transverse                            colon. The polyps were 3 to 4 mm in size. These  polyps were removed with a cold snare. Resection                            and retrieval were complete.                           Four sessile polyps were found in the sigmoid                            colon. The polyps were 3 to 4 mm in size. These                            polyps were removed with a cold snare. Resection                            and retrieval were complete.                           The colon was extremely tortuous. Cecal intubation                            achieved using pediatric colonoscope, water                            immersion technique.                           There were small internal hemorrhoids. The exam was                            otherwise without abnormality. Complications:            No immediate complications. Estimated blood loss:                            Minimal. Estimated Blood Loss:     Estimated blood loss was minimal. Impression:                - Diverticulosis in the entire examined colon.                           - Four 3 to 4 mm polyps in the transverse colon,                            removed with a cold snare. Resected and retrieved.                           - Four 3 to 4 mm polyps in the sigmoid colon,                            removed with a cold snare. Resected and retrieved.                           - Tortuous colon.                           -  Internal hemorrhoids                           - The examination was otherwise normal. Recommendation:           - Patient has a contact number available for                            emergencies. The signs and symptoms of potential                            delayed complications were discussed with the                            patient. Return to normal activities tomorrow.                            Written discharge instructions were provided to the                            patient.                           - Resume previous diet.                           - Continue present medications.                           - Await pathology results. Remo Lipps P. Armbruster, MD 05/14/2019 12:14:34 PM This report has been signed electronically.

## 2019-05-14 NOTE — Progress Notes (Signed)
Temperature- June Bullock VS- Courtney Washington 

## 2019-05-19 ENCOUNTER — Telehealth: Payer: Self-pay

## 2019-05-19 NOTE — Telephone Encounter (Signed)
  Follow up Call-  Call back number 05/14/2019  Post procedure Call Back phone  # [phone]  Permission to leave phone message Yes  Some recent data might be hidden     Patient questions:  Do you have a fever, pain , or abdominal swelling? No. Pain Score  0 *  Have you tolerated food without any problems? Yes.    Have you been able to return to your normal activities? Yes.    Do you have any questions about your discharge instructions: Diet   No. Medications  No. Follow up visit  No.  Do you have questions or concerns about your Care? No.  Actions: * If pain score is 4 or above: 1. No action needed, pain <4.Have you developed a fever since your procedure? no  2.   Have you had an respiratory symptoms (SOB or cough) since your procedure? no  3.   Have you tested positive for COVID 19 since your procedure no  4.   Have you had any family members/close contacts diagnosed with the COVID 19 since your procedure?  no   If yes to any of these questions please route to Joylene John, RN and Alphonsa Gin, Therapist, sports.

## 2019-05-21 ENCOUNTER — Encounter: Payer: Self-pay | Admitting: Gastroenterology

## 2019-06-11 ENCOUNTER — Ambulatory Visit: Payer: Medicare Other | Admitting: Gastroenterology

## 2020-04-07 ENCOUNTER — Telehealth: Payer: Self-pay | Admitting: General Practice

## 2020-04-07 NOTE — Telephone Encounter (Signed)
New Message  Pt called asking if we have received referral

## 2020-05-03 NOTE — Progress Notes (Deleted)
Zipporah Plants, MD Reason for referral-abnormal electrocardiogram  HPI: 84 year old female for evaluation of abnormal electrocardiogram at request of Rudene Anda, MD. Electrocardiogram March 30, 2020 report (copy not available for review) sinus rhythm, septal infarct.  Current Outpatient Medications  Medication Sig Dispense Refill  . Calcium Carbonate-Vit D-Min (RA CALCIUM 600/VIT D/MINERALS) 600-200 MG-UNIT TABS Take by mouth.    . cycloSPORINE (RESTASIS) 0.05 % ophthalmic emulsion 1 drop 2 (two) times daily.      . Magnesium 250 MG TABS Take by mouth.    . Multiple Vitamin (MULTIVITAMIN) capsule Take by mouth.    . Potassium Gluconate 550 (90 K) MG TABS Take 550 mg by mouth daily.    Marland Kitchen zolpidem (AMBIEN) 5 MG tablet Take 2.5 mg by mouth.     Current Facility-Administered Medications  Medication Dose Route Frequency Provider Last Rate Last Admin  . 0.9 %  sodium chloride infusion  500 mL Intravenous Once Armbruster, Carlota Raspberry, MD        Allergies  Allergen Reactions  . Amoxicillin Nausea Only  . Demerol [Meperidine]   . Sulfa Antibiotics     Past Medical History:  Diagnosis Date  . Cataract   . Colon polyps 1990  . Difficulty sleeping   . Diverticulitis   . Hemorrhoids, internal   . Muscle cramps   . Urinary frequency     Past Surgical History:  Procedure Laterality Date  . CATARACT EXTRACTION    . COLONOSCOPY    . HERNIA REPAIR  10/2018   x2- mesh inserted with second surgery  . UTERINE FIBROID SURGERY      Social History   Socioeconomic History  . Marital status: Widowed    Spouse name: Not on file  . Number of children: Not on file  . Years of education: Not on file  . Highest education level: Not on file  Occupational History  . Not on file  Tobacco Use  . Smoking status: Never Smoker  . Smokeless tobacco: Never Used  Vaping Use  . Vaping Use: Never used  Substance and Sexual Activity  . Alcohol use: Never  . Drug use: Never  . Sexual activity:  Not Currently  Other Topics Concern  . Not on file  Social History Narrative  . Not on file   Social Determinants of Health   Financial Resource Strain:   . Difficulty of Paying Living Expenses: Not on file  Food Insecurity:   . Worried About Charity fundraiser in the Last Year: Not on file  . Ran Out of Food in the Last Year: Not on file  Transportation Needs:   . Lack of Transportation (Medical): Not on file  . Lack of Transportation (Non-Medical): Not on file  Physical Activity:   . Days of Exercise per Week: Not on file  . Minutes of Exercise per Session: Not on file  Stress:   . Feeling of Stress : Not on file  Social Connections:   . Frequency of Communication with Friends and Family: Not on file  . Frequency of Social Gatherings with Friends and Family: Not on file  . Attends Religious Services: Not on file  . Active Member of Clubs or Organizations: Not on file  . Attends Archivist Meetings: Not on file  . Marital Status: Not on file  Intimate Partner Violence:   . Fear of Current or Ex-Partner: Not on file  . Emotionally Abused: Not on file  . Physically Abused: Not on  file  . Sexually Abused: Not on file    Family History  Problem Relation Age of Onset  . Colon cancer Neg Hx   . Esophageal cancer Neg Hx   . Stomach cancer Neg Hx   . Rectal cancer Neg Hx     ROS: no fevers or chills, productive cough, hemoptysis, dysphasia, odynophagia, melena, hematochezia, dysuria, hematuria, rash, seizure activity, orthopnea, PND, pedal edema, claudication. Remaining systems are negative.  Physical Exam:   There were no vitals taken for this visit.  General:  Well developed/well nourished in NAD Skin warm/dry Patient not depressed No peripheral clubbing Back-normal HEENT-normal/normal eyelids Neck supple/normal carotid upstroke bilaterally; no bruits; no JVD; no thyromegaly chest - CTA/ normal expansion CV - RRR/normal S1 and S2; no murmurs, rubs or  gallops;  PMI nondisplaced Abdomen -NT/ND, no HSM, no mass, + bowel sounds, no bruit 2+ femoral pulses, no bruits Ext-no edema, chords, 2+ DP Neuro-grossly nonfocal  ECG - personally reviewed  A/P  1 abnormal electrocardiogram-question septal infarct based on report. We will arrange echocardiogram to assess wall motion. If normal will not pursue further evaluation as patient is not having cardiac symptoms.  2 hypertension-patient's blood pressure is controlled. Continue present medications.  Kirk Ruths, MD

## 2020-05-10 ENCOUNTER — Ambulatory Visit: Payer: Medicare Other | Admitting: Cardiology

## 2020-06-07 ENCOUNTER — Encounter: Payer: Self-pay | Admitting: Cardiology

## 2020-06-07 NOTE — Progress Notes (Signed)
Megan Plants MD Reason for referral-abnormal electrocardiogram  HPI: 84 year old female for evaluation of abnormal electrocardiogram at request of Rudene Anda MD. Patient recently had an electrocardiogram that she states read prior septal infarct.  We were asked to evaluate.  I do not have a copy of ECG.  She denies dyspnea, chest pain, palpitations or syncope.  No orthopnea, PND or pedal edema.  Current Outpatient Medications  Medication Sig Dispense Refill  . Calcium Carbonate-Vit D-Min (CALCIUM 600+D PLUS MINERALS PO) Take 1 capsule by mouth daily.    Marland Kitchen losartan (COZAAR) 25 MG tablet Take 25 mg by mouth daily.    . Magnesium 250 MG TABS Take by mouth.    . Multiple Vitamin (MULTIVITAMIN) capsule Take by mouth.    . Potassium Gluconate 550 (90 K) MG TABS Take 550 mg by mouth daily.    Marland Kitchen Propylene Glycol (SYSTANE COMPLETE OP) Apply 1 drop to eye as needed (dry eyes). Both eyes    . saline (AYR) GEL 1 spray each nare as needed for dry nostrils in winter    . sodium chloride 1 g tablet Take 1 g by mouth in the morning and at bedtime.    Marland Kitchen zolpidem (AMBIEN) 5 MG tablet Take 2.5 mg by mouth.     Current Facility-Administered Medications  Medication Dose Route Frequency Provider Last Rate Last Admin  . 0.9 %  sodium chloride infusion  500 mL Intravenous Once Armbruster, Carlota Raspberry, MD        Allergies  Allergen Reactions  . Amoxicillin Nausea Only  . Demerol [Meperidine]   . Melatonin Other (See Comments)    Nightmares  . Sulfa Antibiotics   . Trazodone Other (See Comments)    Feels num in AM      Past Medical History:  Diagnosis Date  . Cataract   . Colon polyps 1990  . Difficulty sleeping   . Diverticulitis   . Hemorrhoids, internal   . Hypertension   . Muscle cramps   . Urinary frequency     Past Surgical History:  Procedure Laterality Date  . CATARACT EXTRACTION    . COLONOSCOPY    . HERNIA REPAIR  10/2018   x2- mesh inserted with second surgery  .  UTERINE FIBROID SURGERY      Social History   Socioeconomic History  . Marital status: Widowed    Spouse name: Not on file  . Number of children: Not on file  . Years of education: Not on file  . Highest education level: Not on file  Occupational History  . Not on file  Tobacco Use  . Smoking status: Never Smoker  . Smokeless tobacco: Never Used  Vaping Use  . Vaping Use: Never used  Substance and Sexual Activity  . Alcohol use: Yes    Comment: Rare  . Drug use: Never  . Sexual activity: Not Currently  Other Topics Concern  . Not on file  Social History Narrative  . Not on file   Social Determinants of Health   Financial Resource Strain:   . Difficulty of Paying Living Expenses: Not on file  Food Insecurity:   . Worried About Charity fundraiser in the Last Year: Not on file  . Ran Out of Food in the Last Year: Not on file  Transportation Needs:   . Lack of Transportation (Medical): Not on file  . Lack of Transportation (Non-Medical): Not on file  Physical Activity:   . Days of Exercise per  Week: Not on file  . Minutes of Exercise per Session: Not on file  Stress:   . Feeling of Stress : Not on file  Social Connections:   . Frequency of Communication with Friends and Family: Not on file  . Frequency of Social Gatherings with Friends and Family: Not on file  . Attends Religious Services: Not on file  . Active Member of Clubs or Organizations: Not on file  . Attends Archivist Meetings: Not on file  . Marital Status: Not on file  Intimate Partner Violence:   . Fear of Current or Ex-Partner: Not on file  . Emotionally Abused: Not on file  . Physically Abused: Not on file  . Sexually Abused: Not on file    Family History  Problem Relation Age of Onset  . CVA Mother   . Colon cancer Neg Hx   . Esophageal cancer Neg Hx   . Stomach cancer Neg Hx   . Rectal cancer Neg Hx     ROS: no fevers or chills, productive cough, hemoptysis, dysphasia,  odynophagia, melena, hematochezia, dysuria, hematuria, rash, seizure activity, orthopnea, PND, pedal edema, claudication. Remaining systems are negative.  Physical Exam:   Blood pressure 130/62, pulse 71, height 5\' 1"  (1.549 m), weight 119 lb (54 kg).  General:  Well developed/well nourished in NAD Skin warm/dry Patient not depressed No peripheral clubbing Back-normal HEENT-normal/normal eyelids Neck supple/normal carotid upstroke bilaterally; no bruits; no JVD; no thyromegaly chest - CTA/ normal expansion CV - RRR/normal S1 and S2; no murmurs, rubs or gallops;  PMI nondisplaced Abdomen -NT/ND, no HSM, no mass, + bowel sounds, no bruit 2+ femoral pulses, no bruits Ext-no edema, chords, 2+ DP Neuro-grossly nonfocal  ECG -normal sinus rhythm at a rate of 71, no ST changes.  Personally reviewed  A/P  1 abnormal ECG-by report previous electrocardiogram was read as prior septal infarct.  I do not have a copy of that ECG but today's electrocardiogram is normal.  I suspect that this was likely related to lead placement and not a true abnormality.  She has had no cardiac symptoms.  We will obtain previous electrocardiogram but at this point I do not think further cardiac evaluation is indicated.  2 hypertension-patient's blood pressure is controlled.  Continue present medications and follow.  Kirk Ruths, MD

## 2020-06-14 ENCOUNTER — Encounter: Payer: Self-pay | Admitting: Cardiology

## 2020-06-14 ENCOUNTER — Ambulatory Visit: Payer: Medicare Other | Admitting: Cardiology

## 2020-06-14 ENCOUNTER — Other Ambulatory Visit: Payer: Self-pay

## 2020-06-14 VITALS — BP 130/62 | HR 71 | Ht 61.0 in | Wt 119.0 lb

## 2020-06-14 DIAGNOSIS — R9431 Abnormal electrocardiogram [ECG] [EKG]: Secondary | ICD-10-CM | POA: Diagnosis not present

## 2020-06-14 DIAGNOSIS — I10 Essential (primary) hypertension: Secondary | ICD-10-CM | POA: Diagnosis not present

## 2020-06-14 NOTE — Patient Instructions (Signed)

## 2022-09-25 ENCOUNTER — Encounter: Payer: Self-pay | Admitting: Gastroenterology

## 2022-10-08 ENCOUNTER — Encounter (HOSPITAL_BASED_OUTPATIENT_CLINIC_OR_DEPARTMENT_OTHER): Payer: Self-pay

## 2022-10-08 ENCOUNTER — Other Ambulatory Visit: Payer: Self-pay

## 2022-10-08 ENCOUNTER — Emergency Department (HOSPITAL_BASED_OUTPATIENT_CLINIC_OR_DEPARTMENT_OTHER)
Admission: EM | Admit: 2022-10-08 | Discharge: 2022-10-08 | Disposition: A | Discharge status: Home / Self Care | Payer: Medicare HMO | Attending: Emergency Medicine | Admitting: Emergency Medicine

## 2022-10-08 DIAGNOSIS — K5641 Fecal impaction: Secondary | ICD-10-CM | POA: Diagnosis not present

## 2022-10-08 DIAGNOSIS — R140 Abdominal distension (gaseous): Secondary | ICD-10-CM | POA: Insufficient documentation

## 2022-10-08 DIAGNOSIS — K5900 Constipation, unspecified: Secondary | ICD-10-CM | POA: Diagnosis present

## 2022-10-08 NOTE — ED Provider Notes (Signed)
Megan Bowen Provider Note   CSN: [id number] Arrival date & time: 10/08/22  1331     History  Chief Complaint  Patient presents with   Constipation    Megan Bowen is a 87 y.o. female with medical history significant for prior hernia repair with mesh in 2020, colon polyps with biopsy showing no evidence of cancer, history of hemorrhoids, history of diverticulitis who presents with concern for intermittent constipation for 2 weeks, with constipation for 5 to 6 days at time my evaluation.  Patient reports that she has tried bowel movements, feels some of nominal discomfort but denies significant abdominal pain.  She denies any fever, chills, lack of flatus.  She does report very small pebble of stool passage after using mineral oil this week.  She has tried docusate, Colace, she reports high-fiber diet, she does report possibly not drinking enough fluids overall.   Constipation      Home Medications Prior to Admission medications   Medication Sig Start Date End Date Taking? Authorizing Provider  Calcium Carbonate-Vit D-Min (CALCIUM 600+D PLUS MINERALS PO) Take 1 capsule by mouth daily.    [provider]  losartan (COZAAR) 25 MG tablet Take 25 mg by mouth daily. 03/30/20   [provider]  Magnesium 250 MG TABS Take by mouth.    [provider]  Multiple Vitamin (MULTIVITAMIN) capsule Take by mouth.    [provider]  Potassium Gluconate 550 (90 K) MG TABS Take 550 mg by mouth daily.    [provider]  Propylene Glycol (SYSTANE COMPLETE OP) Apply 1 drop to eye as needed (dry eyes). Both eyes    [provider]  saline (AYR) GEL 1 spray each nare as needed for dry nostrils in winter    [provider]  sodium chloride 1 g tablet Take 1 g by mouth in the morning and at bedtime.    [provider]  zolpidem (AMBIEN) 5 MG tablet Take 2.5 mg by mouth.    [provider]      Allergies    Amoxicillin, Demerol [meperidine], Melatonin, Sulfa antibiotics, and Trazodone    Review of Systems   Review of Systems  Gastrointestinal:  Positive for constipation.  All other systems reviewed and are negative.   Physical Exam Updated Vital Signs BP (!) 111/93 (BP Location: Right Arm)   Pulse 67   Temp 98.3 F (36.8 C) (Oral)   Resp 18   Ht 5' (1.524 m)   Wt 53.1 kg   SpO2 100%   BMI 22.85 kg/m  Physical Exam Vitals and nursing note reviewed.  Constitutional:      General: She is not in acute distress.    Appearance: Normal appearance.  HENT:     Head: Normocephalic and atraumatic.  Eyes:     General:        Right eye: No discharge.        Left eye: No discharge.  Cardiovascular:     Rate and Rhythm: Normal rate and regular rhythm.     Heart sounds: No murmur heard.    No friction rub. No gallop.  Pulmonary:     Effort: Pulmonary effort is normal.     Breath sounds: Normal breath sounds.  Abdominal:     General: Bowel sounds are normal.     Palpations: Abdomen is soft.     Comments: Some mild distention of abdomen, but no significant tenderness, rebound, guarding.  Normal bowel sounds throughout.  Genitourinary:    Comments: Patient has normal appearance of external rectum, she does have a large firm stool ball in the rectal vault.  I was able to disimpact most of the stool ball without significant difficulty. Skin:    General: Skin is warm and dry.     Capillary Refill: Capillary refill takes less than 2 seconds.  Neurological:     Mental Status: She is alert and oriented to person, place, and time.  Psychiatric:        Mood and Affect: Mood normal.        Behavior: Behavior normal.   Level of MiraLAX  ED Results / Procedures / Treatments   Labs (all labs ordered are listed, but only abnormal results are displayed) Labs Reviewed - No data to display  EKG None  Radiology No results found.  Procedures Fecal  disimpaction  Date/Time: 10/08/2022 3:07 PM  Performed by: Anselmo Pickler, PA-C Authorized by: Anselmo Pickler, PA-C  Consent: Verbal consent obtained. Consent given by: patient Patient understanding: patient states understanding of the procedure being performed Patient consent: the patient's understanding of the procedure matches consent given Procedure consent: procedure consent matches procedure scheduled Relevant documents: relevant documents present and verified Test results: test results available and properly labeled Site marked: the operative site was marked Imaging studies: imaging studies available Patient identity confirmed: verbally with patient Local anesthesia used: no  Anesthesia: Local anesthesia used: no  Sedation: Patient sedated: no        Medications Ordered in ED Medications - No data to display  ED Course/ Medical Decision Making/ A&P                             Medical Decision Making  This patient is a 87 y.o. female who presents to the ED for concern of constipation intermittently for 2 months, worse over the last 5 to 6 days..   Differential diagnoses prior to evaluation: Simple constipation versus small bowel obstruction, diverticulitis, diverticulosis, hemorrhoids, functional bowel obstruction secondary to mass, cancer, versus other  Past Medical History / Social History / Additional history: Chart reviewed. Pertinent results include: Reviewed outpatient PCP, neurology visits, no recent emergency department visits on file  Physical Exam: Physical exam performed. The pertinent findings include: Patient with stool in the rectal vault which was improved after fecal disimpaction, patient with some relief of symptoms after fecal disimpaction, there is still some stool present after disimpaction  Medications / Treatment: Encourage patient to perform bowel prep with MiraLAX at home, continue significant fluid intake, 50 to 64 ounces of  fluids per day, and to follow-up with GI as planned.   Disposition: After consideration of the diagnostic results and the patients response to treatment, I feel that patient symptoms are consistent with simple constipation, she has no significant abdominal tenderness.  She does have a large fecal stool ball consistent with fecal impaction.   emergency department workup does not suggest an emergent condition requiring admission or immediate intervention beyond what has been performed at this time. The plan is: as above. The patient is safe for discharge and has been instructed to return immediately for worsening symptoms, change in symptoms or any other concerns.  Final Clinical Impression(s) / ED Diagnoses Final diagnoses:  Constipation, unspecified constipation type  Fecal impaction in rectum [facility]    Rx / DC Orders ED Discharge Orders     None  Anselmo Pickler, PA-C 10/08/22 1554    Audley Hose, MD 10/14/22 0001

## 2022-10-08 NOTE — Discharge Instructions (Signed)
Bowel prep: Mix HALF of the 238 mg bottle of miralax with 64 ounces of water or Gatorade and drink the entire thing slowly over 1 to 2 hours.  Recommend drinking at least 50 to 64 ounces of fluid every day until you are having complete relief of your constipation, you should be drinking roughly around 50 ounces of water a day in general especially if you suffer from constipation frequently.  Once you do have a satisfying bowel movement you may want to continue to take at least 1 normal dose of MiraLAX, Dulcolax, or your other preferred over-the-counter constipation medications to prevent chronic constipation.  Please maintain your follow-up appointment GI if you continue to have any intermittent constipation.  Was

## 2022-10-08 NOTE — ED Triage Notes (Signed)
Pt presents with complaints of constipation. Bo BM in 6 days. No abdominal pain. Just fullness

## 2022-11-20 ENCOUNTER — Ambulatory Visit: Payer: Medicare Other | Admitting: Gastroenterology

## 2023-03-11 ENCOUNTER — Other Ambulatory Visit (INDEPENDENT_AMBULATORY_CARE_PROVIDER_SITE_OTHER): Payer: Medicare HMO

## 2023-03-11 ENCOUNTER — Encounter: Payer: Self-pay | Admitting: Gastroenterology

## 2023-03-11 ENCOUNTER — Ambulatory Visit: Payer: Medicare HMO | Admitting: Gastroenterology

## 2023-03-11 VITALS — BP 112/52 | HR 71 | Ht 60.0 in | Wt 103.0 lb

## 2023-03-11 DIAGNOSIS — Z86010 Personal history of colonic polyps: Secondary | ICD-10-CM | POA: Diagnosis not present

## 2023-03-11 DIAGNOSIS — E871 Hypo-osmolality and hyponatremia: Secondary | ICD-10-CM

## 2023-03-11 DIAGNOSIS — K5900 Constipation, unspecified: Secondary | ICD-10-CM | POA: Diagnosis not present

## 2023-03-11 DIAGNOSIS — R634 Abnormal weight loss: Secondary | ICD-10-CM

## 2023-03-11 LAB — COMPREHENSIVE METABOLIC PANEL
ALT: 16 U/L (ref 0–35)
AST: 15 U/L (ref 0–37)
Albumin: 4.4 g/dL (ref 3.5–5.2)
Alkaline Phosphatase: 81 U/L (ref 39–117)
BUN: 20 mg/dL (ref 6–23)
CO2: 30 mEq/L (ref 19–32)
Calcium: 10 mg/dL (ref 8.4–10.5)
Chloride: 93 mEq/L — ABNORMAL LOW (ref 96–112)
Creatinine, Ser: 0.8 mg/dL (ref 0.40–1.20)
GFR: 67.31 mL/min (ref >60.00)
Glucose, Bld: 128 mg/dL — ABNORMAL HIGH (ref 70–99)
Potassium: 4.1 mEq/L (ref 3.5–5.1)
Sodium: 131 mEq/L — ABNORMAL LOW (ref 135–145)
Total Bilirubin: 0.4 mg/dL (ref 0.2–1.2)
Total Protein: 7.7 g/dL (ref 6.0–8.3)

## 2023-03-11 LAB — TSH: TSH: 3.09 u[IU]/mL (ref 0.35–5.50)

## 2023-03-11 NOTE — Patient Instructions (Addendum)
If your blood pressure at your visit was 140/90 or greater, please contact your primary care physician to follow up on this. ______________________________________________________  If you are age 85 or older, your body mass index should be between 23-30. Your Body mass index is 20.12 kg/m. If this is out of the aforementioned range listed, please consider follow up with your Primary Care Provider.  If you are age 45 or younger, your body mass index should be between 19-25. Your Body mass index is 20.12 kg/m. If this is out of the aformentioned range listed, please consider follow up with your Primary Care Provider.  ________________________________________________________  The [facility] providers would like to encourage you to use [facility] to communicate with providers for non-urgent requests or questions.  Due to long hold times on the telephone, sending your provider a message by [facility] may be a faster and more efficient way to get a response.  Please allow 48 business hours for a response.  Please remember that this is for non-urgent requests.  _______________________________________________________  Due to recent changes in healthcare laws, you may see the results of your imaging and laboratory studies on MyChart before your provider has had a chance to review them.  We understand that in some cases there may be results that are confusing or concerning to you. Not all laboratory results come back in the same time frame and the provider may be waiting for multiple results in order to interpret others.  Please give [place] 48 hours in order for your provider to thoroughly review all the results before contacting the office for clarification of your results.   Please go to the lab in the basement of our building to have lab work done as you leave today. Hit "B" for basement when you get on the elevator.  When the doors open the lab is on your left.  We will call you with the results. Thank  you. _______________________________________________________________________________  Bonita Quin have been scheduled for a CT scan of the chest, abdomen and pelvis at ______________________,  Radiology. You are scheduled on _____________________ at ___________. You should arrive ________ minutes prior to your appointment time for registration.    Do not eat (or drink) anything after _____________ (4 hours prior to your test)   Plan on being there 30 to 60 minutes, depending on the type of exam you are having performed.   If you have any questions regarding your exam or if you need to reschedule, you may call [facility]iology Scheduling at [phone] between the hours of 8:00 am and 5:00 pm, Monday-Friday.  ________________________________________________________________________________  Stop Colace and Metamucil.  Please purchase the following medications over the counter and take as directed: Miralax: Take as directed once daily for 1 week.  Then increase to twice a day if needed.   Thank you for entrusting me with your care and for choosing [facility], Dr. Ileene Patrick

## 2023-03-11 NOTE — Progress Notes (Signed)
HPI :  87 year old female with a history of colon polyps, diverticulosis, constipation, here to reestablish care for constipation and weight loss.  She was last seen in September 2020.  Recall she has a history of a very tortuous colon with multiple incomplete colonoscopies in the past.  We were able to complete a successful colonoscopy for her in September 2020.  She had 8 small adenomas on exam without any high risk lesions.  She states she tries to eat a very healthy diet with lots of fruits and vegetables and high-fiber.  She also takes Metamucil every day.  Generally she had been moving her bowels okay but since January or so she has had problems with constipation.  She states she has days where she may not have a bowel movement and then when she has 1 she has very hard dry stool and is difficult to pass.  She has significant straining on the toilet to pass stool.  No blood in her stool or no abdominal pains.  She was seen in the ED in January where she had a disimpaction and treated with MiraLAX which worked well for her.  She has been taking Colace stool softeners as well as essentially very high-fiber diet and fiber supplementation but this has not provided good relief for her yet.  Of note she states she is otherwise unintentionally losing weight.  She has been 120 pounds and over the past several months has dropped weight, now down to 103 pounds.  She is not trying to lose weight.  She states she has a great appetite, "eats like a teenager", but is concerned she has continued to lose weight.  She also has hyponatremia noted.  She states she was told to take salt tablets for that in the past but since stopped.  She is concerned about her weight loss.  Again otherwise denies complaints of abdominal pain etc.    Colonoscopy 05/14/2019: - The perianal and digital rectal examinations were normal. - Many small and large-mouthed diverticula were found in the entire colon. - Four sessile polyps were  found in the transverse colon. The polyps were 3 to 4 mm in size. These polyps were removed with a cold snare. Resection and retrieval were complete. - Four sessile polyps were found in the sigmoid colon. The polyps were 3 to 4 mm in size. These polyps were removed with a cold snare. Resection and retrieval were complete. - The colon was extremely tortuous. Cecal intubation achieved using pediatric colonoscope, water immersion technique. - There were small internal hemorrhoids. The exam was otherwise without abnormality.  Surgical [P], colon, sigmoid and transverse, polyp (8) - TUBULAR ADENOMA(S). - NO HIGH GRADE DYSPLASIA OR MALIGNANCY.  Did not recommend any further exams due to difficulty with her exam and age  Past Medical History:  Diagnosis Date   Cataract    Colon polyps 1990   Difficulty sleeping    Diverticulitis    Hemorrhoids, internal    Hypertension    Muscle cramps    Osteopenia    Urinary frequency    Vitamin D deficiency      Past Surgical History:  Procedure Laterality Date   CATARACT EXTRACTION     COLONOSCOPY     HERNIA REPAIR  10/2018   x2- mesh inserted with second surgery   UTERINE FIBROID SURGERY     Family History  Problem Relation Age of Onset   CVA Mother    Colon cancer Neg Hx    Esophageal  cancer Neg Hx    Stomach cancer Neg Hx    Rectal cancer Neg Hx    Social History   Tobacco Use   Smoking status: Never   Smokeless tobacco: Never  Vaping Use   Vaping Use: Never used  Substance Use Topics   Alcohol use: Yes    Comment: Rare   Drug use: Never   Current Outpatient Medications  Medication Sig Dispense Refill   Aspirin (VAZALORE) 81 MG CAPS Take 1 tablet by mouth every other day.     Calcium Carbonate-Vit D-Min (CALCIUM 600+D PLUS MINERALS PO) Take 1 capsule by mouth daily.     docusate sodium (COLACE) 50 MG capsule Take 150 mg by mouth at bedtime.     losartan (COZAAR) 25 MG tablet Take 25 mg by mouth daily.     Magnesium 250 MG TABS  Take by mouth.     Multiple Vitamin (MULTIVITAMIN) capsule Take by mouth.     Potassium Gluconate 550 (90 K) MG TABS Take 550 mg by mouth daily.     Propylene Glycol (SYSTANE COMPLETE OP) Apply 1 drop to eye as needed (dry eyes). Both eyes     psyllium (METAMUCIL) 58.6 % powder One tablespoon daily     saline (AYR) GEL 1 spray each nare as needed for dry nostrils in winter     zolpidem (AMBIEN) 5 MG tablet Take 2.5 mg by mouth.     No current facility-administered medications for this visit.   Allergies  Allergen Reactions   Amoxicillin Nausea Only   Demerol [Meperidine]    Melatonin Other (See Comments)    Nightmares   Sulfa Antibiotics    Trazodone Other (See Comments)    Feels num in AM      Review of Systems: All systems reviewed and negative except where noted in HPI.   Labs reviewed in epic - Na of 130 in April. No anemia  Physical Exam: BP 106/60   Pulse 71   Ht 5' (1.524 m)   Wt 103 lb (46.7 kg)   BMI 20.12 kg/m  Constitutional: Pleasant,well-developed, female in no acute distress. HEENT: Normocephalic and atraumatic. Conjunctivae are normal. No scleral icterus. Neck supple.  Cardiovascular: Normal rate, regular rhythm.  Pulmonary/chest: Effort normal and breath sounds normal.  Abdominal: Soft, nondistended, nontender. there are no masses palpable.  Extremities: no edema Neurological: Alert and oriented to person place and time. Skin: Skin is warm and dry. No rashes noted. Psychiatric: Normal mood and affect. Behavior is normal.   ASSESSMENT: 87 y.o. female here for assessment of the following  1. Constipation, unspecified constipation type   2. Loss of weight   3. Low sodium levels   4. History of colon polyps    Ongoing constipation for the past 6 months or so.  She is eating an extremely high-fiber diet along with taking fiber supplementation and it does not seem to be working.  Fortunately her colonoscopy is up-to-date without any high risk lesions.   We discussed options.  I do not see any new medicine she is taking that would account for her constipation.  I recommend stopping the Metamucil and stopping the Colace for now as it is not working too well.  Recommend starting MiraLAX once daily for now and increase to twice daily if needed.  She can contact me within 2 weeks or so if no improvement so we can adjust her regimen as needed.  I will also check TSH to make sure okay.  Otherwise, she has significant weight loss with some hyponatremia reported of unclear etiology.  She is eating well and has no focal symptoms other than weight loss of almost 20 pounds.  Will check her sodium again to make sure stable and basic labs.  Discussed with her that next steps to evaluate this would be with CT imaging if she wanted to pursue that.  After discussion of it she does wish to have a CT scan, will check chest abdomen pelvis to see if we find a cause for her weight loss and ensure no malignancy especially with her hyponatremia.  She agrees.  Further recommendations pending results and findings.  Of note, given age and no high risk lesions on her colonoscopy, no further plans for routine surveillance colonoscopy for polyps.  PLAN: - start Miralax daily for one week then increase to BID if needed. Call me in a few weeks if no improvement - stop Colace - stop Metamucil - having more than enough dietary fiber - she will call in a few weeks if no improvement - lab today for CMET and TSH - CT C/A/P given weight loss and low Na levels  Harlin Rain, MD [facility]  CC: Truett Perna, MD

## 2023-03-12 ENCOUNTER — Telehealth: Payer: Self-pay | Admitting: Gastroenterology

## 2023-03-12 NOTE — Telephone Encounter (Signed)
PT is returning call to discuss lab results. Please advise. 

## 2023-03-14 NOTE — Telephone Encounter (Signed)
Inbound call from patient requesting a call back regarding recent lab results. Please advise, thank you.  

## 2023-03-14 NOTE — Telephone Encounter (Signed)
Called patient. Relayed results of labs. She expressed understanding.

## 2023-03-20 ENCOUNTER — Ambulatory Visit (HOSPITAL_BASED_OUTPATIENT_CLINIC_OR_DEPARTMENT_OTHER)
Admission: RE | Admit: 2023-03-20 | Discharge: 2023-03-20 | Disposition: A | Discharge status: Home / Self Care | Payer: Medicare HMO | Source: Ambulatory Visit | Attending: Gastroenterology | Admitting: Gastroenterology

## 2023-03-20 ENCOUNTER — Encounter (HOSPITAL_BASED_OUTPATIENT_CLINIC_OR_DEPARTMENT_OTHER): Payer: Self-pay

## 2023-03-20 DIAGNOSIS — I700 Atherosclerosis of aorta: Secondary | ICD-10-CM | POA: Diagnosis not present

## 2023-03-20 DIAGNOSIS — Z86010 Personal history of colonic polyps: Secondary | ICD-10-CM | POA: Diagnosis not present

## 2023-03-20 DIAGNOSIS — K5900 Constipation, unspecified: Secondary | ICD-10-CM | POA: Diagnosis present

## 2023-03-20 DIAGNOSIS — E871 Hypo-osmolality and hyponatremia: Secondary | ICD-10-CM | POA: Insufficient documentation

## 2023-03-20 DIAGNOSIS — R634 Abnormal weight loss: Secondary | ICD-10-CM | POA: Diagnosis present

## 2023-03-20 DIAGNOSIS — K5730 Diverticulosis of large intestine without perforation or abscess without bleeding: Secondary | ICD-10-CM | POA: Insufficient documentation

## 2023-03-20 MED ORDER — IOHEXOL 300 MG/ML  SOLN
100.0000 mL | Freq: Once | INTRAMUSCULAR | Status: AC | PRN
  Administered 2023-03-20: 100 mL via INTRAVENOUS

## 2023-03-21 ENCOUNTER — Telehealth: Payer: Self-pay | Admitting: Gastroenterology

## 2023-03-21 NOTE — Telephone Encounter (Signed)
Returned call to patient. I informed patient that diarrhea is a typical side effect after drinking oral contrast and this should pass soon. Pt reports that she passed a lot of stool yesterday between 5 pm-10:30 pm. Pt states that she was not able to eat dinner. Since this morning patient is doing fine and her appetite has returned. Pt is aware that we will be in touch once CT results have been reviewed. Pt verbalized understanding and had no concerns at the end of the call.

## 2023-03-21 NOTE — Telephone Encounter (Signed)
Inbound call from patient requesting to speak with a nurse as soon as possible .Marland Kitchen She states she had a really bad reaction from the Ct she had done yesterday she is been in the bathroom and has not been able to eat.Please advise

## 2023-03-24 ENCOUNTER — Other Ambulatory Visit: Payer: Self-pay

## 2023-03-24 DIAGNOSIS — K5900 Constipation, unspecified: Secondary | ICD-10-CM

## 2023-03-24 DIAGNOSIS — R634 Abnormal weight loss: Secondary | ICD-10-CM

## 2023-04-07 NOTE — Telephone Encounter (Signed)
Called patient.  She is concerned about how she will do the enema. She indicates that bc she uses a walker she would be unable to get in bed and lay on her side to insert the enema and then get to the bathroom in time to release it. We discussed other positions she might try, closer to the bathroom.  She agrees to get 2 enemas and do a trial run before the procedure. She was encouraged to do her best and not to worry.  She wanted to confirm that Dr. Adela Lank will take biopsies at the time of the procedure and she will not have to come back again to do that separately.  She also wanted to confirm she would use orange Gatorade, just not red or purple.

## 2023-04-07 NOTE — Telephone Encounter (Signed)
Inbound call from patient requesting to speak with a nurse to discuss her upcoming procedure  instruction. Please advise.

## 2023-04-28 ENCOUNTER — Telehealth: Payer: Self-pay | Admitting: Gastroenterology

## 2023-04-28 NOTE — Telephone Encounter (Signed)
Patient is calling to find out if a pre approval has been done for her flex sig on 8/22. Requesting call back.

## 2023-04-30 ENCOUNTER — Telehealth: Payer: Self-pay | Admitting: Internal Medicine

## 2023-04-30 NOTE — Telephone Encounter (Signed)
GI ON CALL PHONE NOTE  The patient called concerned that she was not prepped well enough for tomorrow's flex sig. 3 pm with SPA  Recommended: Miralax 68 gm in 24 oz water tonight Miralax 68 gm in 24 oz water at 8 am Take enema at home as previously recommended Come to the [facility] 30 minutes early IF concerns remain about prep results (our nurses can administer additional enemas if needed.  Wilhemina Bonito. Eda Keys., M.D. [facility] Division of Gastroenterology

## 2023-05-01 ENCOUNTER — Encounter: Payer: Self-pay | Admitting: Gastroenterology

## 2023-05-01 ENCOUNTER — Ambulatory Visit (AMBULATORY_SURGERY_CENTER): Payer: Medicare HMO | Admitting: Gastroenterology

## 2023-05-01 VITALS — BP 134/65 | HR 63 | Temp 96.9°F | Resp 12 | Ht 60.0 in | Wt 103.0 lb

## 2023-05-01 DIAGNOSIS — R933 Abnormal findings on diagnostic imaging of other parts of digestive tract: Secondary | ICD-10-CM

## 2023-05-01 DIAGNOSIS — K5900 Constipation, unspecified: Secondary | ICD-10-CM | POA: Diagnosis not present

## 2023-05-01 MED ORDER — SODIUM CHLORIDE 0.9 % IV SOLN: 500.0000 mL | Freq: Once | INTRAVENOUS | Status: DC

## 2023-05-01 NOTE — Patient Instructions (Signed)
Handout on hemorrhoids and diverticulosis given to patient  Resume previous diet and continue present medications - continue bowel regimen (Miralax daily, titrate up as needed)    YOU HAD AN ENDOSCOPIC PROCEDURE TODAY AT THE [facility]:   Refer to the procedure report that was given to you for any specific questions about what was found during the examination.  If the procedure report does not answer your questions, please call your gastroenterologist to clarify.  If you requested that your care partner not be given the details of your procedure findings, then the procedure report has been included in a sealed envelope for you to review at your convenience later.  YOU SHOULD EXPECT: Some feelings of bloating in the abdomen. Passage of more gas than usual.  Walking can help get rid of the air that was put into your GI tract during the procedure and reduce the bloating. If you had a lower endoscopy (such as a colonoscopy or flexible sigmoidoscopy) you may notice spotting of blood in your stool or on the toilet paper. If you underwent a bowel prep for your procedure, you may not have a normal bowel movement for a few days.  Please Note:  You might notice some irritation and congestion in your nose or some drainage.  This is from the oxygen used during your procedure.  There is no need for concern and it should clear up in a day or so.  SYMPTOMS TO REPORT IMMEDIATELY:  Following lower endoscopy (colonoscopy or flexible sigmoidoscopy):  Excessive amounts of blood in the stool  Significant tenderness or worsening of abdominal pains  Swelling of the abdomen that is new, acute  Fever of 100F or higher  For urgent or emergent issues, a gastroenterologist can be reached at any hour by calling [phone]. Do not use MyChart messaging for urgent concerns.    DIET:  We do recommend a small meal at first, but then you may proceed to your regular diet.  Drink plenty of fluids but you should  avoid alcoholic beverages for 24 hours.  ACTIVITY:  You should plan to take it easy for the rest of today and you should NOT DRIVE or use heavy machinery until tomorrow (because of the sedation medicines used during the test).    FOLLOW UP: Our staff will call the number listed on your records the next business day following your procedure.  We will call around 7:15- 8:00 am to check on you and address any questions or concerns that you may have regarding the information given to you following your procedure. If we do not reach you, we will leave a message.     If any biopsies were taken you will be contacted by phone or by letter within the next 1-3 weeks.  Please call us at [phone] if you have not heard about the biopsies in 3 weeks.    SIGNATURES/CONFIDENTIALITY: You and/or your care partner have signed paperwork which will be entered into your electronic medical record.  These signatures attest to the fact that that the information above on your After Visit Summary has been reviewed and is understood.  Full responsibility of the confidentiality of this discharge information lies with you and/or your care-partner.

## 2023-05-01 NOTE — Progress Notes (Signed)
[facility] History and Physical   Primary Care Physician:  Truett Perna, MD   Reason for Procedure:   Abnormal CT scan - abnormal rectum, constipation    Plan:    Flex sig     HPI: Megan Bowen is a 87 y.o. female  here for flex sig - she has had some constipation, weight loss, CT scan to evaluate showed some asymmetric wall thickening and large volume of stool. Last colonoscopy 05/2019. Flex sig to further evaluate, ensure no rectal malignancy.    Otherwise feels well without any cardiopulmonary symptoms.   I have discussed risks / benefits of anesthesia and endoscopic procedure with Keeshia C Surace and they wish to proceed with the exams as outlined today.    Past Medical History:  Diagnosis Date   Cataract    Colon polyps 1990   Difficulty sleeping    Diverticulitis    Hemorrhoids, internal    Hypertension    Muscle cramps    Osteopenia    Stroke [facility])    Urinary frequency    Vitamin D deficiency     Past Surgical History:  Procedure Laterality Date   CATARACT EXTRACTION     COLONOSCOPY     HERNIA REPAIR  10/2018   x2- mesh inserted with second surgery   UTERINE FIBROID SURGERY      Prior to Admission medications   Medication Sig Start Date End Date Taking? Authorizing Provider  Aspirin (VAZALORE) 81 MG CAPS Take 1 tablet by mouth every other day. 04/25/22  Yes [provider]  Calcium Carbonate-Vit D-Min (CALCIUM 600+D PLUS MINERALS PO) Take 1 capsule by mouth daily.   Yes [provider]  losartan (COZAAR) 25 MG tablet Take 25 mg by mouth daily. 03/30/20  Yes [provider]  Magnesium 250 MG TABS Take by mouth.   Yes [provider]  Multiple Vitamin (MULTIVITAMIN) capsule Take by mouth.   Yes [provider]  Potassium Gluconate 550 (90 K) MG TABS Take 550 mg by mouth daily.   Yes [provider]  zolpidem (AMBIEN) 5 MG tablet Take 2.5 mg by mouth.   Yes [provider]   Propylene Glycol (SYSTANE COMPLETE OP) Apply 1 drop to eye as needed (dry eyes). Both eyes Patient not taking: Reported on 05/01/2023    [provider]  saline (AYR) GEL 1 spray each nare as needed for dry nostrils in winter Patient not taking: Reported on 05/01/2023    [provider]    Current Outpatient Medications  Medication Sig Dispense Refill   Aspirin (VAZALORE) 81 MG CAPS Take 1 tablet by mouth every other day.     Calcium Carbonate-Vit D-Min (CALCIUM 600+D PLUS MINERALS PO) Take 1 capsule by mouth daily.     losartan (COZAAR) 25 MG tablet Take 25 mg by mouth daily.     Magnesium 250 MG TABS Take by mouth.     Multiple Vitamin (MULTIVITAMIN) capsule Take by mouth.     Potassium Gluconate 550 (90 K) MG TABS Take 550 mg by mouth daily.     zolpidem (AMBIEN) 5 MG tablet Take 2.5 mg by mouth.     Propylene Glycol (SYSTANE COMPLETE OP) Apply 1 drop to eye as needed (dry eyes). Both eyes (Patient not taking: Reported on 05/01/2023)     saline (AYR) GEL 1 spray each nare as needed for dry nostrils in winter (Patient not taking: Reported on 05/01/2023)     Current Facility-Administered Medications  Medication  Dose Route Frequency Provider Last Rate Last Admin   0.9 %  sodium chloride infusion  500 mL Intravenous Once Shakemia Madera, Willaim Rayas, MD        Allergies as of 05/01/2023 - Review Complete 05/01/2023  Allergen Reaction Noted   Amoxicillin Nausea Only 12/02/2017   Demerol [meperidine]  06/01/2014   Melatonin Other (See Comments) 10/15/2019   Sulfa antibiotics  06/01/2014   Trazodone Other (See Comments) 10/15/2019    Family History  Problem Relation Age of Onset   CVA Mother    Colon cancer Neg Hx    Esophageal cancer Neg Hx    Stomach cancer Neg Hx    Rectal cancer Neg Hx     Social History   Socioeconomic History   Marital status: Widowed    Spouse name: Not on file   Number of children: 0   Years of education: Not on file   Highest education  level: Not on file  Occupational History   Not on file  Tobacco Use   Smoking status: Never   Smokeless tobacco: Never  Vaping Use   Vaping status: Never Used  Substance and Sexual Activity   Alcohol use: Yes    Comment: Rare   Drug use: Never   Sexual activity: Not Currently  Other Topics Concern   Not on file  Social History Narrative   Not on file   Social Determinants of Health   Financial Resource Strain: Low Risk  (04/15/2022)   [facility] visits prior to 11/09/2022.   Overall Financial Resource Strain (CARDIA)    Difficulty of Paying Living Expenses: Not hard at all  Food Insecurity: No Food Insecurity (04/15/2022)   [facility] to 11/09/2022[facility] visits prior to 11/09/2022.   Hunger Vital Sign    Worried About Running Out of Food in the Last Year: Never true    Ran Out of Food in the Last Year: Never true  Transportation Needs: No Transportation Needs (04/15/2022)   [facility] visits prior to 11/09/2022[facility] visits prior to 11/09/2022.   PRAPARE - Administrator, Civil Service (Medical): No    Lack of Transportation (Non-Medical): No  Physical Activity: Unknown (04/15/2022)   [facility] prior to 11/09/2022[facility] visits prior to 11/09/2022.   Exercise Vital Sign    Days of Exercise per Week: Patient declined    Minutes of Exercise per Session: 0 min  Stress: No Stress Concern Present (04/15/2022)   [facility] prior to 11/09/2022[facility] visits prior to  11/09/2022.   [employer] of Occupational Health - Occupational Stress Questionnaire    Feeling of Stress : Not at all  Social Connections: Moderately Integrated (04/15/2022)   Received from [facility] visits prior to 11/09/2022., [facility] visits prior to 11/09/2022.   Social Connection and Isolation Panel [NHANES]    Frequency of Communication with Friends and Family: More than three times a week    Frequency of Social Gatherings with Friends and Family: Twice a week  Attends Religious Services: More than 4 times per year    Active Member of Clubs or Organizations: Yes    Attends Banker Meetings: More than 4 times per year    Marital Status: Widowed  Intimate Partner Violence: Not At Risk (04/15/2022)   [facility] prior to 11/09/2022., [facility] visits prior to 11/09/2022.   Humiliation, Afraid, Rape, and Kick questionnaire    Fear of Current or Ex-Partner: No    Emotionally Abused: No    Physically Abused: No    Sexually Abused: No    Review of Systems: All other review of systems negative except as mentioned in the HPI.  Physical Exam: Vital signs BP (!) 152/64   Pulse 64   Temp (!) 96.9 F (36.1 C)   Ht 5' (1.524 m)   Wt 103 lb (46.7 kg)   SpO2 98%   BMI 20.12 kg/m   General:   Alert,  Well-developed, pleasant and cooperative in NAD Lungs:  Clear throughout to auscultation.   Heart:  Regular rate and rhythm Abdomen:  Soft, nontender and nondistended.   Neuro/Psych:  Alert and cooperative. Normal mood and affect. A and O x 3  Harlin Rain, MD [facility]

## 2023-05-01 NOTE — Progress Notes (Signed)
Uneventful anesthetic. Report to pacu rn. Vss. Care resumed by rn. 

## 2023-05-01 NOTE — Telephone Encounter (Signed)
Thanks John, appreciate your help with her.

## 2023-05-01 NOTE — Op Note (Signed)
[facility] Patient Name: Megan Bowen Procedure Date: 05/01/2023 3:16 PM MRN: [id number] Endoscopist: Viviann Spare P. Adela Lank , MD, [id number] Age: 87 Referring MD:  Date of Birth: 1935/11/01 Gender: Female Account #: [id number] Procedure:                Flexible Sigmoidoscopy Indications:              Abnormal CT of the GI tract - rectal thickening,                            worsening constipation Medicines:                None, Monitored Anesthesia Care Procedure:                Pre-Anesthesia Assessment:                           - Prior to the procedure, a History and Physical                            was performed, and patient medications and                            allergies were reviewed. The patient's tolerance of                            previous anesthesia was also reviewed. The risks                            and benefits of the procedure and the sedation                            options and risks were discussed with the patient.                            All questions were answered, and informed consent                            was obtained. Prior Anticoagulants: The patient has                            taken no anticoagulant or antiplatelet agents. ASA                            Grade Assessment: II - A patient with mild systemic                            disease. After reviewing the risks and benefits,                            the patient was deemed in satisfactory condition to                            undergo the procedure.  After obtaining informed consent, the scope was                            passed under direct vision. The Olympus Scope                            [id number] was introduced through the anus and                            advanced to the the sigmoid colon. The flexible                            sigmoidoscopy was accomplished without difficulty.                            The patient tolerated  the procedure well. The                            quality of the bowel preparation was poor. Scope In: 3:28:17 PM Scope Out: 3:33:59 PM Total Procedure Duration: 0 hours 5 minutes 42 seconds  Findings:                 The perianal and digital rectal examinations were                            normal.                           A large amount of semi-liquid stool was found in                            the rectum, in the recto-sigmoid colon and in the                            sigmoid colon, making visualization difficult.                            Lavage of the rectum / rectosigmoid colon was                            performed using copious amounts of sterile water,                            resulting in clearance with adequate visualization                            in these areas. The distal sigmoid was reached and                            visualization was poor due to residual stool.                           A few small-mouthed diverticula were found in the  sigmoid colon.                           Internal hemorrhoids were found during retroflexion.                           The exam was otherwise without abnormality. Complications:            No immediate complications. Estimated blood loss:                            None. Estimated Blood Loss:     Estimated blood loss: none. Impression:               - Stool in the rectum, in the recto-sigmoid colon                            and in the sigmoid colon, requiring lavage to                            obtain adequate views of the rectum / rectosigmoid                            colon                           - Diverticulosis in the distal sigmoid colon.                           - Internal hemorrhoids.                           - The examination was otherwise normal.                           - No abnormalities noted in the rectum to correlate                            to CT findings, which could  have been related to                            constipation at the time. Recommendation:           - Discharge patient to home.                           - Resume previous diet.                           - Continue present medications.                           - Continue bowel regimen - Miralax daily, titrate                            up as needed [employer]. Adela Lank, MD 05/01/2023 3:40:30 PM This report has been signed electronically.

## 2023-05-02 ENCOUNTER — Telehealth: Payer: Self-pay

## 2023-05-02 NOTE — Telephone Encounter (Signed)
Left message on answering machine. 

## 2024-08-20 ENCOUNTER — Encounter: Payer: Self-pay | Admitting: Neurology

## 2024-11-30 ENCOUNTER — Ambulatory Visit: Payer: Self-pay | Admitting: Neurology
